# Patient Record
Sex: Female | Born: 1955
Health system: Southern US, Community
[De-identification: ages and names within clinical notes are randomized; demographics above are authoritative.]

## PROBLEM LIST (undated history)

## (undated) DIAGNOSIS — R112 Nausea with vomiting, unspecified: Secondary | ICD-10-CM

## (undated) DIAGNOSIS — I10 Essential (primary) hypertension: Secondary | ICD-10-CM

## (undated) DIAGNOSIS — S0300XA Dislocation of jaw, unspecified side, initial encounter: Secondary | ICD-10-CM

## (undated) DIAGNOSIS — Z9889 Other specified postprocedural states: Secondary | ICD-10-CM

## (undated) DIAGNOSIS — K219 Gastro-esophageal reflux disease without esophagitis: Secondary | ICD-10-CM

## (undated) DIAGNOSIS — F419 Anxiety disorder, unspecified: Secondary | ICD-10-CM

## (undated) HISTORY — PX: MYOMECTOMY: SHX85

## (undated) HISTORY — PX: OTHER SURGICAL HISTORY: SHX169

## (undated) HISTORY — PX: SHOULDER SURGERY: SHX246

## (undated) HISTORY — PX: BREAST SURGERY: SHX581

---

## 1996-03-23 HISTORY — PX: MYOMECTOMY: SHX85

## 1997-06-27 ENCOUNTER — Other Ambulatory Visit: Admission: RE | Admit: 1997-06-27 | Discharge: 1997-06-27 | Payer: Self-pay | Admitting: Gynecology

## 1997-07-31 ENCOUNTER — Other Ambulatory Visit: Admission: RE | Admit: 1997-07-31 | Discharge: 1997-07-31 | Payer: Self-pay | Admitting: Gynecology

## 1998-06-04 ENCOUNTER — Other Ambulatory Visit: Admission: RE | Admit: 1998-06-04 | Discharge: 1998-06-04 | Payer: Self-pay | Admitting: Family Medicine

## 2004-07-17 ENCOUNTER — Other Ambulatory Visit: Admission: RE | Admit: 2004-07-17 | Discharge: 2004-07-17 | Payer: Self-pay | Admitting: Obstetrics and Gynecology

## 2004-10-24 ENCOUNTER — Ambulatory Visit (HOSPITAL_COMMUNITY): Admission: RE | Admit: 2004-10-24 | Discharge: 2004-10-24 | Payer: Self-pay | Admitting: Specialist

## 2005-03-26 ENCOUNTER — Ambulatory Visit: Payer: Self-pay | Admitting: Internal Medicine

## 2010-04-13 ENCOUNTER — Encounter: Payer: Self-pay | Admitting: Specialist

## 2012-10-17 ENCOUNTER — Other Ambulatory Visit (HOSPITAL_COMMUNITY): Payer: Self-pay | Admitting: Nurse Practitioner

## 2012-10-17 DIAGNOSIS — Z139 Encounter for screening, unspecified: Secondary | ICD-10-CM

## 2012-10-20 ENCOUNTER — Ambulatory Visit (HOSPITAL_COMMUNITY)
Admission: RE | Admit: 2012-10-20 | Discharge: 2012-10-20 | Disposition: A | Discharge status: Home / Self Care | Payer: Self-pay | Source: Ambulatory Visit | Attending: Family Medicine | Admitting: Family Medicine

## 2012-10-20 DIAGNOSIS — Z139 Encounter for screening, unspecified: Secondary | ICD-10-CM

## 2013-09-06 ENCOUNTER — Encounter (HOSPITAL_COMMUNITY): Payer: Self-pay | Admitting: Emergency Medicine

## 2013-09-06 ENCOUNTER — Emergency Department (HOSPITAL_COMMUNITY)
Admission: EM | Admit: 2013-09-06 | Discharge: 2013-09-06 | Discharge status: Against Medical Advice | Payer: Self-pay | Attending: Emergency Medicine | Admitting: Emergency Medicine

## 2013-09-06 ENCOUNTER — Emergency Department (HOSPITAL_COMMUNITY): Payer: Self-pay

## 2013-09-06 DIAGNOSIS — F411 Generalized anxiety disorder: Secondary | ICD-10-CM | POA: Insufficient documentation

## 2013-09-06 DIAGNOSIS — Z7982 Long term (current) use of aspirin: Secondary | ICD-10-CM | POA: Insufficient documentation

## 2013-09-06 DIAGNOSIS — R0602 Shortness of breath: Secondary | ICD-10-CM | POA: Insufficient documentation

## 2013-09-06 DIAGNOSIS — R0789 Other chest pain: Secondary | ICD-10-CM | POA: Insufficient documentation

## 2013-09-06 DIAGNOSIS — R079 Chest pain, unspecified: Secondary | ICD-10-CM

## 2013-09-06 LAB — CBC
HEMATOCRIT: 38.7 % (ref 36.0–46.0)
Hemoglobin: 12.8 g/dL (ref 12.0–15.0)
MCH: 29.5 pg (ref 26.0–34.0)
MCHC: 33.1 g/dL (ref 30.0–36.0)
MCV: 89.2 fL (ref 78.0–100.0)
Platelets: 274 10*3/uL (ref 150–400)
RBC: 4.34 MIL/uL (ref 3.87–5.11)
RDW: 13.9 % (ref 11.5–15.5)
WBC: 8 10*3/uL (ref 4.0–10.5)

## 2013-09-06 LAB — BASIC METABOLIC PANEL
BUN: 16 mg/dL (ref 6–23)
CO2: 24 mEq/L (ref 19–32)
CREATININE: 0.74 mg/dL (ref 0.50–1.10)
Calcium: 9.5 mg/dL (ref 8.4–10.5)
Chloride: 101 mEq/L (ref 96–112)
GFR calc Af Amer: >90 mL/min (ref >90)
Glucose, Bld: 94 mg/dL (ref 70–99)
Potassium: 4.4 mEq/L (ref 3.7–5.3)
SODIUM: 139 meq/L (ref 137–147)

## 2013-09-06 LAB — I-STAT TROPONIN, ED: Troponin i, poc: 0 ng/mL (ref 0.00–0.08)

## 2013-09-06 LAB — TROPONIN I: Troponin I: <0.3 ng/mL (ref <0.30)

## 2013-09-06 NOTE — ED Provider Notes (Signed)
CSN: [id number]     Arrival date & time 09/06/13  1833 History   First MD Initiated Contact with Patient 09/06/13 2009     Chief Complaint  Patient presents with  . Chest Pain     (Consider location/radiation/quality/duration/timing/severity/associated sxs/prior Treatment) HPI  The patient presents to the emergency department with complaints of chest pressure and heaviness that started this afternoon acutely when going from a sitting to standing position. It lasted approximately 5 minutes and was accompanied by difficulty breathing. She reports that she walks back and forth and this helped at resolve. Within the next 30 minutes she walked into her office and reports developing another episode of the same feeling of a band like pressure around her chest with heaviness and having to consciously work on breathing in and out. She reports that eventually tapered off. She denies taking any medications on a daily and reports being a very healthy at baseline. She'll family history of hypertension and her mom has a history of atrial fibrillation. She has never seen a cardiologist and has never had any chest pains or before. She drove herself here to the ER and since then has not had any recurrence of these episodes.  History reviewed. No pertinent past medical history. Past Surgical History  Procedure Laterality Date  . Myomectomy    . Arm surgery    . Shoulder surgery     No family history on file. History  Substance Use Topics  . Smoking status: Never Smoker   . Smokeless tobacco: Not on file  . Alcohol Use: No   OB History   Grav Para Term Preterm Abortions TAB SAB Ect Mult Living                 Review of Systems   Review of Systems  Gen: no weight loss, fevers, chills, night sweats  Eyes: no discharge or drainage, no occular pain or visual changes  Nose: no epistaxis or rhinorrhea  Mouth: no dental pain, no sore throat  Neck: no neck pain  Lungs:No wheezing, coughing or  hemoptysis + SOB CV: + chest pain, No palpitations, dependent edema or orthopnea  Abd: no abdominal pain, nausea, vomiting, diarrhea GU: no dysuria or gross hematuria  MSK:  No muscle weakness or pain Neuro: no headache, no focal neurologic deficits  Skin: no rash or wounds Psyche: + anxious    Allergies  Review of patient's allergies indicates no known allergies.  Home Medications   Prior to Admission medications   Medication Sig Start Date End Date Taking? Authorizing Lamir Racca  aspirin 81 MG tablet Take 324 mg by mouth daily.   Yes Historical Curry Dulski, MD   BP 135/94  Pulse 70  Temp(Src) 99.8 F (37.7 C) (Oral)  Resp 18  Wt 154 lb 9.6 oz (70.126 kg)  SpO2 98% Physical Exam  Nursing note and vitals reviewed. Constitutional: She appears well-developed and well-nourished. No distress.  HENT:  Head: Normocephalic and atraumatic.  Eyes: Pupils are equal, round, and reactive to light.  Neck: Normal range of motion. Neck supple.  Cardiovascular: Normal rate and regular rhythm.   Pulmonary/Chest: Effort normal. She has no decreased breath sounds. She has no wheezes. She exhibits no tenderness, no laceration and no crepitus.  No chest wall tenderness to palpation. Unable to reproduce symptoms  Abdominal: Soft. Bowel sounds are normal. There is no tenderness. There is no guarding and no CVA tenderness.  Neurological: She is alert.  Skin: Skin is warm and dry.  Psychiatric: Her speech is normal. Her mood appears anxious.  Pt appears anxious    ED Course  Procedures (including critical care time) Labs Review Labs Reviewed  CBC  BASIC METABOLIC PANEL  TROPONIN I  Rosezena SensorI-STAT TROPOININ, ED    Imaging Review Dg Chest 2 View  09/06/2013   CLINICAL DATA:  Heavy discomfort and pressure in the mid chest today  EXAM: CHEST  2 VIEW  COMPARISON:  None.  FINDINGS: The heart size and mediastinal contours are within normal limits. There is no focal infiltrate, pulmonary edema, or pleural  effusion. The visualized skeletal structures are unremarkable.  IMPRESSION: No active cardiopulmonary disease.   Electronically Signed   By: Sherian ReinWei-Chen  Lin M.D.   On: 09/06/2013 19:44     EKG Interpretation None     Milinda PointerBRISTOW, Akilah [id number] 06-Sep-2013 18:42:45 [facility] ROUTINE RECORD Normal sinus rhythm Low voltage QRS Septal infarct , age undetermined Abnormal ECG 3325mm/s 6410mm/mV 100Hz  8.0.1 12SL 241 HD CID: [id number] Referred by: Unconfirmed Vent. rate 96 BPM PR interval 134 ms QRS duration 82 ms QT/QTc 350/442 ms P-R-T axes 60 35 48 16-May-1955 (58 yr) Female Caucasian Room: Loc:11 Technician: [id number] Test ind:  MDM   Final diagnoses:  Chest pain   Patient to the ER with complaints of Chest tightness and SOB that happened x 2 and lasted less than 5 minutes in each episodes. She is healthy at baseline and denies ever having episodes of the same in the past.   9: 46 pm Initially her work-up is negative. Her chest xray is normal, labs and troponin are reassuring. She had a second Troponin ordered at 3 hours which is also negative. Will have patient ambulated in the ED to ensure she is not having pain exacerbated with exertion. Currently pain is resolved with no intervention.  11:06 pm Delta Troponin returned negative. I feel that patients symptoms are not consistent with ACS. Patient was waiting for my attending to see her but no longer wanted to wait and left without letting anybody know.  Dorthula Matasiffany G Greene, PA-C 09/06/13 2308

## 2013-09-06 NOTE — ED Notes (Signed)
Pt. reports chest pressure/heaviness onset this afternoon , denies SOB , nausea or diaphoresis .

## 2013-09-06 NOTE — ED Notes (Signed)
Pt left without notifying ED staff. Neva SeatGreene PA made aware.

## 2013-09-07 NOTE — ED Provider Notes (Signed)
Medical screening examination/treatment/procedure(s) were performed by non-physician practitioner and as supervising physician I was immediately available for consultation/collaboration.  Elliott L Wentz, MD 09/07/13 0013 

## 2017-06-04 DIAGNOSIS — H5213 Myopia, bilateral: Secondary | ICD-10-CM | POA: Diagnosis not present

## 2017-10-01 ENCOUNTER — Other Ambulatory Visit: Payer: Self-pay | Admitting: Family Medicine

## 2017-10-01 DIAGNOSIS — F39 Unspecified mood [affective] disorder: Secondary | ICD-10-CM | POA: Diagnosis not present

## 2017-10-01 DIAGNOSIS — R7301 Impaired fasting glucose: Secondary | ICD-10-CM | POA: Diagnosis not present

## 2017-10-01 DIAGNOSIS — Z1231 Encounter for screening mammogram for malignant neoplasm of breast: Secondary | ICD-10-CM

## 2017-10-01 DIAGNOSIS — R635 Abnormal weight gain: Secondary | ICD-10-CM | POA: Diagnosis not present

## 2017-10-01 DIAGNOSIS — I10 Essential (primary) hypertension: Secondary | ICD-10-CM | POA: Diagnosis not present

## 2017-10-01 MED FILL — CITALOPRAM HBR 20 MG TABLET: 20 | 90 days supply | Qty: 90 | Fill #0

## 2017-10-01 MED FILL — LISINOPRIL 10 MG TABLET: 10 | 90 days supply | Qty: 90 | Fill #0

## 2017-10-01 MED FILL — QUETIAPINE FUMARATE 100 MG: 100 | 90 days supply | Qty: 90 | Fill #0

## 2017-10-01 MED FILL — CYCLOBENZAPRINE 10 MG TAB: 10 | 30 days supply | Qty: 90 | Fill #0

## 2017-10-04 MED FILL — clonazePAM 0.5 MG TABS: 0.5 | 30 days supply | Qty: 30 | Fill #0

## 2017-11-19 MED FILL — CYCLOBENZAPRINE 10 MG TAB: 10 | 30 days supply | Qty: 90 | Fill #1

## 2017-12-06 DIAGNOSIS — F4320 Adjustment disorder, unspecified: Secondary | ICD-10-CM | POA: Diagnosis not present

## 2017-12-06 DIAGNOSIS — G479 Sleep disorder, unspecified: Secondary | ICD-10-CM | POA: Diagnosis not present

## 2017-12-06 DIAGNOSIS — I10 Essential (primary) hypertension: Secondary | ICD-10-CM | POA: Diagnosis not present

## 2017-12-06 DIAGNOSIS — F39 Unspecified mood [affective] disorder: Secondary | ICD-10-CM | POA: Diagnosis not present

## 2017-12-21 MED FILL — CYCLOBENZAPRINE 10 MG TAB: 10 | 30 days supply | Qty: 90 | Fill #2

## 2017-12-24 MED FILL — clonazePAM 0.5 MG TABS: 0.5 | 30 days supply | Qty: 30 | Fill #0

## 2018-01-07 MED FILL — LISINOPRIL 10 MG TABLET: 10 | 90 days supply | Qty: 90 | Fill #0

## 2018-01-07 MED FILL — QUETIAPINE FUMARATE 100 MG: 100 | 90 days supply | Qty: 90 | Fill #1

## 2018-01-07 MED FILL — CITALOPRAM HBR 20 MG TABLET: 20 | 90 days supply | Qty: 90 | Fill #1

## 2018-02-18 MED FILL — clonazePAM 0.5 MG TABS: 0.5 | 30 days supply | Qty: 30 | Fill #1

## 2018-02-21 MED FILL — CYCLOBENZAPRINE 10 MG TAB: 10 | 30 days supply | Qty: 90 | Fill #3

## 2018-04-07 ENCOUNTER — Emergency Department (HOSPITAL_COMMUNITY)
Admission: EM | Admit: 2018-04-07 | Discharge: 2018-04-07 | Disposition: A | Discharge status: Home / Self Care | Payer: 59 | Attending: Emergency Medicine | Admitting: Emergency Medicine

## 2018-04-07 ENCOUNTER — Encounter (HOSPITAL_COMMUNITY): Payer: Self-pay

## 2018-04-07 ENCOUNTER — Emergency Department (HOSPITAL_COMMUNITY): Payer: 59

## 2018-04-07 DIAGNOSIS — Y929 Unspecified place or not applicable: Secondary | ICD-10-CM | POA: Diagnosis not present

## 2018-04-07 DIAGNOSIS — S91311A Laceration without foreign body, right foot, initial encounter: Secondary | ICD-10-CM | POA: Insufficient documentation

## 2018-04-07 DIAGNOSIS — W25XXXA Contact with sharp glass, initial encounter: Secondary | ICD-10-CM | POA: Insufficient documentation

## 2018-04-07 DIAGNOSIS — Y999 Unspecified external cause status: Secondary | ICD-10-CM | POA: Insufficient documentation

## 2018-04-07 DIAGNOSIS — Y9389 Activity, other specified: Secondary | ICD-10-CM | POA: Insufficient documentation

## 2018-04-07 MED ORDER — LIDOCAINE-EPINEPHRINE (PF) 2 %-1:200000 IJ SOLN
10.0000 mL | Freq: Once | INTRAMUSCULAR | Status: AC
  Administered 2018-04-07: 10 mL
  Filled 2018-04-07: qty 20

## 2018-04-07 MED ORDER — NAPROXEN 500 MG PO TABS: 500.0000 mg | ORAL_TABLET | Freq: Two times a day (BID) | ORAL | 0 refills | Status: DC

## 2018-04-07 NOTE — ED Provider Notes (Signed)
[facility] EMERGENCY DEPARTMENT Provider Note   CSN: [id number] Arrival date & time: 04/07/18  1704   History   Chief Complaint Chief Complaint  Patient presents with  . Extremity Laceration    HPI Elizabeth Roberts is a 63 y.o. female presenting with a right foot extremity laceration onset at 4:30pm today. Patient states she was attempting to stop her dogs from fighting and accidentally kicked a Ship broker. Patient reports bleeding since the incident.  Patient reports walking makes her symptoms worse and resting makes the pain better. Last tetanus shot was in 2016. Patient denies weakness, numbness, or paresthesias. Patient reports difficulty ambulating due to pain. Patient reports taking aspirin daily. Patient denies ankle or toe pain. Patient denies a history of diabetes.   HPI  History reviewed. No pertinent past medical history.  There are no active problems to display for this patient.   Past Surgical History:  Procedure Laterality Date  . arm surgery    . MYOMECTOMY    . SHOULDER SURGERY       OB History   No obstetric history on file.      Home Medications    Prior to Admission medications   Medication Sig Start Date End Date Taking? Authorizing Provider  aspirin 81 MG tablet Take 324 mg by mouth daily.    [provider]  naproxen (NAPROSYN) 500 MG tablet Take 1 tablet (500 mg total) by mouth 2 (two) times daily. 04/07/18   Leretha Dykes, PA-C    Family History History reviewed. No pertinent family history.  Social History Social History   Tobacco Use  . Smoking status: Never Smoker  Substance Use Topics  . Alcohol use: No  . Drug use: No     Allergies   Patient has no known allergies.   Review of Systems Review of Systems  Constitutional: Negative for chills, diaphoresis and fever.  Respiratory: Negative for shortness of breath.   Cardiovascular: Negative for leg swelling.  Gastrointestinal: Negative for abdominal  pain, nausea and vomiting.  Musculoskeletal: Positive for arthralgias, gait problem and joint swelling. Negative for back pain.  Skin: Positive for wound. Negative for rash.  Allergic/Immunologic: Negative for immunocompromised state.  Hematological: Does not bruise/bleed easily.     Physical Exam Updated Vital Signs BP 138/87 (BP Location: Right Arm)   Pulse 87   Temp 99.1 F (37.3 C) (Oral)   Resp 17   SpO2 98%   Physical Exam Vitals signs and nursing note reviewed.  Constitutional:      General: She is not in acute distress.    Appearance: She is well-developed. She is not diaphoretic.  HENT:     Head: Normocephalic and atraumatic.  Cardiovascular:     Rate and Rhythm: Normal rate and regular rhythm.     Heart sounds: Normal heart sounds. No murmur. No friction rub. No gallop.   Pulmonary:     Effort: Pulmonary effort is normal. No respiratory distress.     Breath sounds: Normal breath sounds. No wheezing or rales.  Abdominal:     Palpations: Abdomen is soft.     Tenderness: There is no abdominal tenderness.  Musculoskeletal: Normal range of motion.       Feet:  Skin:    Findings: Laceration present. No erythema or rash.     Comments: Heavy bleeding noted on exam. 6cm laceration noted over the medial aspect of right foot. Full ROM of ankle, foot, and toes. 5/5 strength with dorsiflexion and  plantar flexion. Sensation intact. 2+ DP pulses.   Neurological:     Mental Status: She is alert and oriented to person, place, and time.      ED Treatments / Results  Labs (all labs ordered are listed, but only abnormal results are displayed) Labs Reviewed - No data to display  EKG None  Radiology Dg Foot Complete Right  Result Date: 04/07/2018 CLINICAL DATA:  63 year old female status post foot lacerations and pain after kicking a mirror. EXAM: RIGHT FOOT COMPLETE - 3+ VIEW COMPARISON:  None. FINDINGS: Abundant dressing material about the right foot, limiting soft  tissue detail. No radiopaque foreign body identified. Right foot osseous structures appear intact. No fracture or dislocation identified. IMPRESSION: 1. No radiopaque foreign body, but note that not all glass is radiopaque. 2. No acute fracture or dislocation identified about the right foot. Electronically Signed   By: Odessa Fleming M.D.   On: 04/07/2018 18:45    Procedures .Marland KitchenLaceration Repair Date/Time: 04/07/2018 11:34 PM Performed by: Leretha Dykes, PA-C Authorized by: Leretha Dykes, PA-C   Consent:    Consent obtained:  Verbal   Consent given by:  Patient   Risks discussed:  Infection, poor wound healing and pain   Alternatives discussed:  No treatment Anesthesia (see MAR for exact dosages):    Anesthesia method:  Local infiltration   Local anesthetic:  Lidocaine 2% WITH epi Laceration details:    Location:  Foot   Foot location:  Sole of R foot   Length (cm):  6   Depth (mm):  6 Repair type:    Repair type:  Complex Pre-procedure details:    Preparation:  Patient was prepped and draped in usual sterile fashion and imaging obtained to evaluate for foreign bodies Exploration:    Hemostasis achieved with:  Direct pressure   Wound exploration: wound explored through full range of motion     Contaminated: no   Treatment:    Area cleansed with:  Betadine   Amount of cleaning:  Standard   Irrigation solution:  Sterile saline   Irrigation method:  Pressure wash   Visualized foreign bodies/material removed: no   Skin repair:    Repair method:  Sutures   Suture size:  5-0   Suture material:  Prolene   Suture technique:  Horizontal mattress   Number of sutures:  5 Approximation:    Approximation:  Close Post-procedure details:    Dressing:  Bulky dressing   Patient tolerance of procedure:  Tolerated well, no immediate complications   (including critical care time)  Medications Ordered in ED Medications  lidocaine-EPINEPHrine (XYLOCAINE W/EPI) 2 %-1:200000 (PF) injection  10 mL (10 mLs Infiltration Given 04/07/18 2028)     Initial Impression / Assessment and Plan / ED Course  I have reviewed the triage vital signs and the nursing notes.  Pertinent labs & imaging results that were available during my care of the patient were reviewed by me and considered in my medical decision making (see chart for details).  Clinical Course as of Apr 07 2322  Thu Apr 07, 2018  2006 No acute fracture or dislocation identified about the right foot.  DG Foot Complete Right [AH]    Clinical Course User Index [AH] Leretha Dykes, PA-C   Patient presents with a foot laceration. Laceration was repaired. Pressure irrigation performed. Wound explored and base of wound visualized without evidence of foreign body.  Laceration occurred < 8 hours prior to repair which was well tolerated.  Pt has no comorbidities to effect normal wound healing. Pt discharged without antibiotics.  Discussed suture home care with patient and answered questions. Pt to follow-up for wound check in 24 to 48 hours and suture removal in 10-14 days; they are to return to the ED sooner for signs of infection. Pt is hemodynamically stable with no complaints prior to dc.   Findings and plan of care discussed with supervising physician Dr. Denton LankSteinl.  Final Clinical Impressions(s) / ED Diagnoses   Final diagnoses:  Laceration of right foot, initial encounter    ED Discharge Orders         Ordered    naproxen (NAPROSYN) 500 MG tablet  2 times daily     04/07/18 2215           Leretha DykesHernandez, Jariel Drost P, [place]PA-C 04/07/18 2337    Cathren LaineSteinl, Kevin, MD 04/14/18 1400

## 2018-04-07 NOTE — Discharge Instructions (Addendum)
You have been seen today for right foot laceration. Please read and follow all provided instructions.   1. Medications: naproxen for pain, usual home medications 2. Treatment: rest, drink plenty of fluids, your sutures will have to be removed in 10-14 days 3. Follow Up: Please follow up with your primary doctor in 1-2 days for discussion of your diagnoses and further evaluation after today's visit; if you do not have a primary care doctor use the resource guide provided to find one; Please return to the ER for any new or worsening symptoms. Please obtain all of your results from medical records or have your doctors office obtain the results - share them with your doctor - you should be seen at your doctors office. Call today to arrange your follow up.   Take medications as prescribed. Please review all of the medicines and only take them if you do not have an allergy to them. Return to the emergency room for worsening condition or new concerning symptoms. Follow up with your regular doctor. If you don't have a regular doctor use one of the numbers below to establish a primary care doctor.  Please be aware that if you are taking birth control pills, taking other prescriptions, ESPECIALLY ANTIBIOTICS may make the birth control ineffective - if this is the case, either do not engage in sexual activity or use alternative methods of birth control such as condoms until you have finished the medicine and your family doctor says it is OK to restart them. If you are on a blood thinner such as COUMADIN, be aware that any other medicine that you take may cause the coumadin to either work too much, or not enough - you should have your coumadin level rechecked in next 7 days if this is the case.  ?  It is also a possibility that you have an allergic reaction to any of the medicines that you have been prescribed - Everybody reacts differently to medications and while MOST people have no trouble with most medicines, you  may have a reaction such as nausea, vomiting, rash, swelling, shortness of breath. If this is the case, please stop taking the medicine immediately and contact your physician.  ?  You should return to the ER if you develop severe or worsening symptoms.   Emergency Department Resource Guide 1) Find a Doctor and Pay Out of Pocket Although you won't have to find out who is covered by your insurance plan, it is a good idea to ask around and get recommendations. You will then need to call the office and see if the doctor you have chosen will accept you as a new patient and what types of options they offer for patients who are self-pay. Some doctors offer discounts or will set up payment plans for their patients who do not have insurance, but you will need to ask so you aren't surprised when you get to your appointment.  2) Contact Your Local Health Department Not all health departments have doctors that can see patients for sick visits, but many do, so it is worth a call to see if yours does. If you don't know where your local health department is, you can check in your phone book. The CDC also has a tool to help you locate your state's health department, and many state websites also have listings of all of their local health departments.  3) Find a Walk-in Clinic If your illness is not likely to be very severe or complicated, you  may want to try a walk in clinic. These are popping up all over the country in pharmacies, drugstores, and shopping centers. They're usually staffed by nurse practitioners or physician assistants that have been trained to treat common illnesses and complaints. They're usually fairly quick and inexpensive. However, if you have serious medical issues or chronic medical problems, these are probably not your best option.  No Primary Care Doctor: Call Health Connect at  [phone] - they can help you locate a primary care doctor that  accepts your insurance, provides certain services,  etc. Physician Referral [phone]  Emergency Department Resource Guide 1) Find a Doctor and Pay Out of Pocket Although you won't have to find out who is covered by your insurance plan, it is a good idea to ask around and get recommendations. You will then need to call the office and see if the doctor you have chosen will accept you as a new patient and what types of options they offer for patients who are self-pay. Some doctors offer discounts or will set up payment plans for their patients who do not have insurance, but you will need to ask so you aren't surprised when you get to your appointment.  2) Contact Your Local Health Department Not all health departments have doctors that can see patients for sick visits, but many do, so it is worth a call to see if yours does. If you don't know where your local health department is, you can check in your phone book. The CDC also has a tool to help you locate your state's health department, and many state websites also have listings of all of their local health departments.  3) Find a Walk-in Clinic If your illness is not likely to be very severe or complicated, you may want to try a walk in clinic. These are popping up all over the country in pharmacies, drugstores, and shopping centers. They're usually staffed by nurse practitioners or physician assistants that have been trained to treat common illnesses and complaints. They're usually fairly quick and inexpensive. However, if you have serious medical issues or chronic medical problems, these are probably not your best option.  No Primary Care Doctor: Call Health Connect at  [phone] - they can help you locate a primary care doctor that  accepts your insurance, provides certain services, etc. Physician Referral Service- [phone]  Chronic Pain Problems: Organization         Address  Phone   Notes  [facility]  [phone] Patients need to be referred by their  primary care doctor.   Medication Assistance: Organization         Address  Phone   Notes  [facility] Medication [facility] [address] [place], [place] [address] [phone] --Must be a resident of [facility] -- Must have NO insurance coverage whatsoever (no Medicaid/ Medicare, etc.) -- The pt. MUST have a primary care doctor that directs their care regularly and follows them in the community   [facility]  [phone]   [employer]  [phone]    Agencies that provide inexpensive medical care: Organization         Address  Phone   Notes  [facility]  [phone]   [facility]    [phone]   [facility] [address] [place], [place] [address] [phone]   [facility] [address] [place], [place] (  [phone]   Planned Parenthood    [phone]   [facility]    [phone]   [facility] and [facility]  [address], [place] Phone:  [phone], Fax:  [phone] Hours of Operation:  9 am - 6 pm, M-F.  Also accepts Medicaid/Medicare and self-pay.  [facility]  [address], [place] Phone: [phone], Fax: [phone]. Hours of Operation:  8:30 am - 5:30 pm, M-F.  Also accepts Medicaid and self-pay.  [facility] [place] [address] [place] Phone: [phone]   [facility] [address] Natasha Bence [place], [place] [phone], Ext. [phone] Mondays & Thursdays: 7-9 AM.  First 15 patients are seen on a first come, first serve basis.    Medicaid-accepting [facility] Providers:  Organization         Address  Phone   Notes  [facility] [address], [place] [phone] Also accepts self-pay patients.  [facility] [address] Laurell Josephs [place], [place]  [phone]   [facility] [address], [place] [phone]   [facility] [address], [place] [phone]   Renaye Rakers [address], [place]   [phone] Only accepts [place] patients after they have their name applied to their card.   Self-Pay (no insurance) in [facility]:  Organization         Address  Phone   Notes  Sickle Cell Patients, [facility] [address] [place], [place] [phone]   [facility] [address] [place], [place] [phone]   [facility]  [address], [place] [phone]   [facility]/Dr. Osei-Bonsu  [address] [place] or [address], [place] [phone] Phone number for both [place] and [place] locations is the same.  [facility]gent [facility] and [facility] [address], [place] [phone]   [facility] [address] [place] or [address] [phone]   [facility] [address], [place] [phone], phone; [phone], fax Sees patients 1st and 3rd Saturday of every month.  Must not qualify for public or private insurance (i.e. Medicaid, Medicare, [place] Health Choice, Veterans' Benefits)  Household income should be no more than 200% of the poverty level The clinic cannot treat you if you are pregnant or think you are pregnant  Sexually transmitted diseases are not treated at the clinic.

## 2018-04-07 NOTE — ED Notes (Signed)
Pt given bedside commode; able to use independently.

## 2018-04-07 NOTE — ED Notes (Addendum)
Pt's foot is still bleeding at this time. Floor was cleaned and pt was encouraged to keep foot as still as possible.

## 2018-04-07 NOTE — ED Triage Notes (Signed)
Pt has 2 inch laceration to foot that occurred when accidentally kicking a mirror trying to break up her dogs from fighting. VSS. PMS intact.

## 2018-04-08 DIAGNOSIS — S91311D Laceration without foreign body, right foot, subsequent encounter: Secondary | ICD-10-CM | POA: Diagnosis not present

## 2018-04-08 DIAGNOSIS — M79671 Pain in right foot: Secondary | ICD-10-CM | POA: Diagnosis not present

## 2018-04-13 MED FILL — QUETIAPINE FUMARATE 100 MG: 100 | 90 days supply | Qty: 90 | Fill #2

## 2018-04-13 MED FILL — SSD 1% CREAM: 1 | 30 days supply | Qty: 50 | Fill #0

## 2018-04-13 MED FILL — CYCLOBENZAPRINE 10 MG TAB: 10 | 30 days supply | Qty: 90 | Fill #0

## 2018-04-13 MED FILL — LISINOPRIL 10 MG TABS: 10 | 90 days supply | Qty: 90 | Fill #0

## 2018-04-15 DIAGNOSIS — M79671 Pain in right foot: Secondary | ICD-10-CM | POA: Diagnosis not present

## 2018-04-15 DIAGNOSIS — S91311D Laceration without foreign body, right foot, subsequent encounter: Secondary | ICD-10-CM | POA: Diagnosis not present

## 2018-04-15 MED FILL — DOXYCYCLINE HYCLATE 100 MG: 100 | 10 days supply | Qty: 20 | Fill #0

## 2018-04-21 DIAGNOSIS — M79671 Pain in right foot: Secondary | ICD-10-CM | POA: Diagnosis not present

## 2018-04-21 DIAGNOSIS — B999 Unspecified infectious disease: Secondary | ICD-10-CM | POA: Diagnosis not present

## 2018-04-21 DIAGNOSIS — S91311D Laceration without foreign body, right foot, subsequent encounter: Secondary | ICD-10-CM | POA: Diagnosis not present

## 2018-05-02 DIAGNOSIS — M79671 Pain in right foot: Secondary | ICD-10-CM | POA: Diagnosis not present

## 2018-05-02 DIAGNOSIS — L03115 Cellulitis of right lower limb: Secondary | ICD-10-CM | POA: Diagnosis not present

## 2018-05-02 DIAGNOSIS — S91311D Laceration without foreign body, right foot, subsequent encounter: Secondary | ICD-10-CM | POA: Diagnosis not present

## 2018-05-09 DIAGNOSIS — I10 Essential (primary) hypertension: Secondary | ICD-10-CM | POA: Diagnosis not present

## 2018-05-09 DIAGNOSIS — S91311D Laceration without foreign body, right foot, subsequent encounter: Secondary | ICD-10-CM | POA: Diagnosis not present

## 2018-05-09 DIAGNOSIS — M79671 Pain in right foot: Secondary | ICD-10-CM | POA: Diagnosis not present

## 2018-05-09 DIAGNOSIS — L03115 Cellulitis of right lower limb: Secondary | ICD-10-CM | POA: Diagnosis not present

## 2018-05-16 DIAGNOSIS — I10 Essential (primary) hypertension: Secondary | ICD-10-CM | POA: Diagnosis not present

## 2018-05-16 DIAGNOSIS — L03115 Cellulitis of right lower limb: Secondary | ICD-10-CM | POA: Diagnosis not present

## 2018-05-16 DIAGNOSIS — M79671 Pain in right foot: Secondary | ICD-10-CM | POA: Diagnosis not present

## 2018-05-16 DIAGNOSIS — S91311D Laceration without foreign body, right foot, subsequent encounter: Secondary | ICD-10-CM | POA: Diagnosis not present

## 2018-06-01 MED FILL — CYCLOBENZAPRINE 10 MG TAB: 10 | 30 days supply | Qty: 90 | Fill #1 | Status: TO

## 2018-06-15 DIAGNOSIS — I10 Essential (primary) hypertension: Secondary | ICD-10-CM | POA: Diagnosis not present

## 2018-06-15 DIAGNOSIS — L905 Scar conditions and fibrosis of skin: Secondary | ICD-10-CM | POA: Diagnosis not present

## 2018-07-02 MED FILL — LISINOPRIL 10 MG TABLET: 10 | 90 days supply | Qty: 90 | Fill #0

## 2018-07-02 MED FILL — CITALOPRAM HBR 20 MG TABLET: 20 | 90 days supply | Qty: 90 | Fill #0

## 2018-07-02 MED FILL — clonazePAM 0.5 MG TABS: 0.5 | 30 days supply | Qty: 30 | Fill #0

## 2018-07-02 MED FILL — QUETIAPINE FUMARATE 100 MG: 100 | 90 days supply | Qty: 90 | Fill #0

## 2018-07-12 MED FILL — CYCLOBENZAPRINE HCL 10 MG T: 10 | 30 days supply | Qty: 90 | Fill #0 | Status: TO

## 2018-08-19 MED FILL — clonazePAM 0.5 MG TABS: 0.5 | 30 days supply | Qty: 30 | Fill #0

## 2018-08-22 MED FILL — CYCLOBENZAPRINE 10 MG TAB: 10 | 30 days supply | Qty: 90 | Fill #0

## 2018-10-04 MED FILL — CITALOPRAM HBR 20 MG TABLET: 20 | 90 days supply | Qty: 90 | Fill #0

## 2018-10-05 MED FILL — CYCLOBENZAPRINE 10 MG TAB: 10 | 30 days supply | Qty: 90 | Fill #0

## 2018-10-06 ENCOUNTER — Other Ambulatory Visit: Payer: Self-pay | Admitting: Family Medicine

## 2018-10-06 DIAGNOSIS — Z1231 Encounter for screening mammogram for malignant neoplasm of breast: Secondary | ICD-10-CM

## 2018-10-06 DIAGNOSIS — I10 Essential (primary) hypertension: Secondary | ICD-10-CM | POA: Diagnosis not present

## 2018-10-06 DIAGNOSIS — L905 Scar conditions and fibrosis of skin: Secondary | ICD-10-CM | POA: Diagnosis not present

## 2018-10-06 DIAGNOSIS — L989 Disorder of the skin and subcutaneous tissue, unspecified: Secondary | ICD-10-CM | POA: Diagnosis not present

## 2018-10-06 MED FILL — clonazePAM 0.5 MG TABS: 0.5 | 30 days supply | Qty: 30 | Fill #0

## 2018-10-24 DIAGNOSIS — Z1211 Encounter for screening for malignant neoplasm of colon: Secondary | ICD-10-CM | POA: Diagnosis not present

## 2018-10-24 MED FILL — PLENVU 140 GM SOLR: 140 | 1 days supply | Qty: 3 | Fill #0

## 2018-11-08 MED FILL — LISINOPRIL 10 MG TABS: 10 | 90 days supply | Qty: 90 | Fill #0

## 2018-11-17 DIAGNOSIS — K633 Ulcer of intestine: Secondary | ICD-10-CM | POA: Diagnosis not present

## 2018-11-17 DIAGNOSIS — K6389 Other specified diseases of intestine: Secondary | ICD-10-CM | POA: Diagnosis not present

## 2018-11-17 DIAGNOSIS — K559 Vascular disorder of intestine, unspecified: Secondary | ICD-10-CM | POA: Diagnosis not present

## 2018-11-17 DIAGNOSIS — Z1211 Encounter for screening for malignant neoplasm of colon: Secondary | ICD-10-CM | POA: Diagnosis not present

## 2018-11-17 DIAGNOSIS — K5730 Diverticulosis of large intestine without perforation or abscess without bleeding: Secondary | ICD-10-CM | POA: Diagnosis not present

## 2018-11-21 MED FILL — CYCLOBENZAPRINE 10 MG TAB: 10 | 30 days supply | Qty: 90 | Fill #0

## 2018-11-22 ENCOUNTER — Other Ambulatory Visit: Payer: Self-pay

## 2018-11-22 ENCOUNTER — Ambulatory Visit
Admission: RE | Admit: 2018-11-22 | Discharge: 2018-11-22 | Disposition: A | Discharge status: Home / Self Care | Payer: 59 | Source: Ambulatory Visit | Attending: Family Medicine | Admitting: Family Medicine

## 2018-11-22 DIAGNOSIS — Z1231 Encounter for screening mammogram for malignant neoplasm of breast: Secondary | ICD-10-CM

## 2018-11-24 ENCOUNTER — Other Ambulatory Visit: Payer: Self-pay | Admitting: Family Medicine

## 2018-11-24 DIAGNOSIS — R928 Other abnormal and inconclusive findings on diagnostic imaging of breast: Secondary | ICD-10-CM

## 2018-11-29 MED FILL — clonazePAM 0.5 MG TABS: 0.5 | 30 days supply | Qty: 30 | Fill #0

## 2018-11-30 ENCOUNTER — Other Ambulatory Visit: Payer: Self-pay

## 2018-11-30 ENCOUNTER — Ambulatory Visit
Admission: RE | Admit: 2018-11-30 | Discharge: 2018-11-30 | Disposition: A | Discharge status: Home / Self Care | Payer: 59 | Source: Ambulatory Visit | Attending: Family Medicine | Admitting: Family Medicine

## 2018-11-30 ENCOUNTER — Other Ambulatory Visit: Payer: Self-pay | Admitting: Family Medicine

## 2018-11-30 DIAGNOSIS — N6312 Unspecified lump in the right breast, upper inner quadrant: Secondary | ICD-10-CM | POA: Diagnosis not present

## 2018-11-30 DIAGNOSIS — R928 Other abnormal and inconclusive findings on diagnostic imaging of breast: Secondary | ICD-10-CM

## 2018-11-30 DIAGNOSIS — N6310 Unspecified lump in the right breast, unspecified quadrant: Secondary | ICD-10-CM

## 2018-11-30 MED FILL — QUETIAPINE FUMARATE 100 MG: 100 | 90 days supply | Qty: 90 | Fill #0

## 2018-12-05 ENCOUNTER — Ambulatory Visit
Admission: RE | Admit: 2018-12-05 | Discharge: 2018-12-05 | Disposition: A | Discharge status: Home / Self Care | Payer: 59 | Source: Ambulatory Visit | Attending: Family Medicine | Admitting: Family Medicine

## 2018-12-05 ENCOUNTER — Other Ambulatory Visit: Payer: Self-pay

## 2018-12-05 DIAGNOSIS — N6310 Unspecified lump in the right breast, unspecified quadrant: Secondary | ICD-10-CM

## 2018-12-05 DIAGNOSIS — N6011 Diffuse cystic mastopathy of right breast: Secondary | ICD-10-CM | POA: Diagnosis not present

## 2018-12-05 DIAGNOSIS — N6312 Unspecified lump in the right breast, upper inner quadrant: Secondary | ICD-10-CM | POA: Diagnosis not present

## 2018-12-05 DIAGNOSIS — R928 Other abnormal and inconclusive findings on diagnostic imaging of breast: Secondary | ICD-10-CM | POA: Diagnosis not present

## 2018-12-14 ENCOUNTER — Other Ambulatory Visit: Payer: Self-pay | Admitting: General Surgery

## 2018-12-14 DIAGNOSIS — R928 Other abnormal and inconclusive findings on diagnostic imaging of breast: Secondary | ICD-10-CM | POA: Diagnosis not present

## 2018-12-14 DIAGNOSIS — Z9889 Other specified postprocedural states: Secondary | ICD-10-CM | POA: Diagnosis not present

## 2018-12-14 DIAGNOSIS — Z6830 Body mass index (BMI) 30.0-30.9, adult: Secondary | ICD-10-CM | POA: Diagnosis not present

## 2018-12-19 ENCOUNTER — Other Ambulatory Visit: Payer: Self-pay | Admitting: General Surgery

## 2018-12-19 DIAGNOSIS — R928 Other abnormal and inconclusive findings on diagnostic imaging of breast: Secondary | ICD-10-CM

## 2019-01-11 MED FILL — CYCLOBENZAPRINE 10 MG TAB: 10 | 30 days supply | Qty: 90 | Fill #0

## 2019-01-18 MED FILL — CITALOPRAM HBR 20 MG TABLET: 20 | 90 days supply | Qty: 90 | Fill #0

## 2019-02-01 ENCOUNTER — Encounter (HOSPITAL_BASED_OUTPATIENT_CLINIC_OR_DEPARTMENT_OTHER): Payer: Self-pay

## 2019-02-01 ENCOUNTER — Other Ambulatory Visit: Payer: Self-pay

## 2019-02-04 ENCOUNTER — Other Ambulatory Visit (HOSPITAL_COMMUNITY)
Admission: RE | Admit: 2019-02-04 | Discharge: 2019-02-04 | Disposition: A | Discharge status: Home / Self Care | Payer: 59 | Source: Ambulatory Visit | Attending: General Surgery | Admitting: General Surgery

## 2019-02-04 DIAGNOSIS — Z01812 Encounter for preprocedural laboratory examination: Secondary | ICD-10-CM | POA: Diagnosis not present

## 2019-02-04 DIAGNOSIS — Z20828 Contact with and (suspected) exposure to other viral communicable diseases: Secondary | ICD-10-CM | POA: Insufficient documentation

## 2019-02-04 NOTE — H&P (Signed)
Elizabeth Roberts Location: [facility] Patient #: [id number] DOB: 25-Sep-1955 Married / Language: English / Race: White Female      History of Present Illness  The patient is a 63 year old female who presents with a breast mass. 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 Referred by Dr. Norva Pavlov at the [facility] cause of 2 discordant biopsies in the right breast. PCP is Nadyne Coombes. Acey Lav served as my Biomedical engineer throughout the encounter.      In 2003 she underwent 2 left breast biopsies for completely benign process. This was in another state. Otherwise no breast problems. She gets routine screening mammograms.       Recent mammogram showed 2 small areas of distortion in the right breast. Both of these areas were biopsied. In the upper right breast, middle depth, she showed fibrocystic change and this was felt to be discordant In the right breast, upper inner quadrant middle depth this was biopsied and showed normal breast parenchyma. The clip migrated inferiorly a little bit. This was also felt to be discordant. Biopsy was recommended of both of these areas. I explained that the risk of upgrade cancer here it is not precise but probably somewhere between 5 and 8%. She does not want to take a chance and once these areas excised.      Past history reveals open myomectomy. Left breast biopsy 2 in  2003. BMI 30. Family history is negative for breast, ovarian, prostate, or pancreatic cancer next Lasix was reveals she is married. No children. Denies alcohol or tobacco. Lives in [place]. 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      She'll be scheduled for right breast lumpectomy 2 with radioactive seed localization 2.She agrees with this plan.   Allergies  No Known Allergies  No Known Drug Allergies  Allergies Reconciled   Medication History  clonazePAM (0.5MG  Tablet, Oral)  Active. Citalopram Hydrobromide (20MG  Tablet, Oral) Active. Cyclobenzaprine HCl (10MG  Tablet, Oral) Active. Lisinopril (10MG  Tablet, Oral) Active. QUEtiapine Fumarate (100MG  Tablet, Oral) Active. Plenvu (140GM For Solution, Oral) Active. Multiple Vitamins/Iron (1 (one) Oral) Active. Medications Reconciled  Social History  Caffeine use  Carbonated beverages, Tea. Tobacco use  Never smoker.  Family History  Melanoma  Family Members In General.  Pregnancy / Birth History  Age of men[age]  [age]    Review of Systems  General Not Present- Appetite Loss, Chills, Fatigue, Fever, Night Sweats, Weight Gain and Weight Loss. Skin Not Present- Change in Wart/Mole, Dryness, Hives, Jaundice, New Lesions, Non-Healing Wounds, Rash and Ulcer. HEENT Present- Wears glasses/contact lenses. Not Present- Earache, Hearing Loss, Hoarseness, Nose Bleed, Oral Ulcers, Ringing in the Ears, Seasonal Allergies, Sinus Pain, Sore Throat, Visual Disturbances and Yellow Eyes. Respiratory Not Present- Bloody sputum, Chronic Cough, Difficulty Breathing, Snoring and Wheezing. Breast Present- Breast Mass. Not Present- Breast Pain, Nipple Discharge and Skin Changes. Cardiovascular Not Present- Chest Pain, Difficulty Breathing Lying Down, Leg Cramps, Palpitations, Rapid Heart Rate, Shortness of Breath and Swelling of Extremities. Gastrointestinal Not Present- Abdominal Pain, Bloating, Bloody Stool, Change in Bowel Habits, Chronic diarrhea, Constipation, Difficulty Swallowing, Excessive gas, Gets full quickly at meals, Hemorrhoids, Indigestion, Nausea, Rectal Pain and Vomiting. Female Genitourinary Not Present- Frequency, Nocturia, Painful Urination, Pelvic Pain and Urgency. Musculoskeletal Not Present- Back Pain, Joint Pain, Joint Stiffness, Muscle Pain, Muscle Weakness and Swelling of Extremities. Neurological Not Present- Decreased Memory, Fainting, Headaches, Numbness, Seizures, Tingling, Tremor, Trouble  walking and Weakness. Psychiatric Not Present- Anxiety, Bipolar, Change in Sleep Pattern, Depression, Fearful and Frequent crying. Endocrine Not Present- Cold Intolerance, Excessive Hunger, Hair Changes, Heat Intolerance, Hot flashes and New Diabetes. Hematology Not Present- Blood Thinners, Easy Bruising, Excessive bleeding, Gland problems, HIV and Persistent Infections.  Vitals  Weight: 187.4 lb Height: 66in Body Surface Area: 1.95 m Body Mass Index: 30.25 kg/m  Temp.: 97.65F(Temporal)  Pulse: 104 (Regular)  BP: 148/92 (Sitting, Left Arm, Standard)       Physical Exam  General Mental Status-Alert. General Appearance-Consistent with stated age. Hydration-Well hydrated. Voice-Normal.  Head and Neck Head-normocephalic, atraumatic with no lesions or palpable masses. Trachea-midline. Thyroid Gland Characteristics - normal size and consistency.  Eye Eyeball - Bilateral-Extraocular movements intact. Sclera/Conjunctiva - Bilateral-No scleral icterus.  Chest and Lung Exam Chest and lung exam reveals -quiet, even and easy respiratory effort with no use of accessory muscles and on auscultation, normal breath sounds, no adventitious sounds and normal vocal resonance. Inspection Chest Wall - Normal. Back - normal.  Breast Note: Breasts are large. 2 biopsy sites right breast 12:00 and 2:00. Tiny palpable hematoma at 12:00. No other skin changes. No other masses. No adenopathy.   Cardiovascular Cardiovascular examination reveals -normal heart sounds, regular rate and rhythm with no murmurs and normal pedal pulses bilaterally.  Abdomen Inspection Inspection of the abdomen reveals - No Hernias. Skin - Scar - Note: Well-healed Pfannenstiel incision. Palpation/Percussion Palpation and Percussion of the abdomen reveal - Soft, Non Tender, No Rebound tenderness, No Rigidity (guarding) and No hepatosplenomegaly. Auscultation Auscultation of the abdomen  reveals - Bowel sounds normal.  Neurologic Neurologic evaluation reveals -alert and oriented x 3 with no impairment of recent or remote memory. Mental Status-Normal.  Musculoskeletal Normal Exam - Left-Upper Extremity Strength Normal and Lower Extremity Strength Normal. Normal Exam - Right-Upper Extremity Strength Normal and Lower Extremity Strength Normal.  Lymphatic Head & Neck  General Head & Neck Lymphatics: Bilateral - Description - Normal. Axillary  General Axillary Region: Bilateral - Description - Normal. Tenderness - Non Tender. Femoral & Inguinal  Generalized Femoral & Inguinal Lymphatics: Bilateral - Description - Normal. Tenderness - Non Tender.    Assessment & Plan ABNORMALITY OF RIGHT BREAST ON SCREENING MAMMOGRAM (R92.8)e.   Your recent screening mammograms showed 2 areas of distortion in the upper and slightly in her right breast both of these areas were biopsied and both of them revealed benign findings However, the radiologist feels this is discordant and is still concerned about the low but definite risk of cancer I agree that excision is recommended There is probably a 90-95% chance that he did not have cancer, however  After lengthy discussion, we both decided to go ahead with the excisional biopsy You'll be scheduled for right breast lumpectomy 2 with radioactive seed localization 2 We have discussed the indications, techniques, and risk of the surgery with you in detail  BMI 30.0-30.9,ADULT (Z68.30) HISTORY OF MYOMECTOMY (Z98.890) HISTORY OF LEFT BREAST BIOPSY (Z98.890)    Edsel Petrin. Dalbert Batman, M.D., [facility] General and Minimally invasive Surgery Breast and Colorectal Surgery Office:   [phone]

## 2019-02-05 LAB — NOVEL CORONAVIRUS, NAA (HOSP ORDER, SEND-OUT TO REF LAB; TAT 18-24 HRS): SARS-CoV-2, NAA: NOT DETECTED

## 2019-02-06 ENCOUNTER — Encounter (HOSPITAL_BASED_OUTPATIENT_CLINIC_OR_DEPARTMENT_OTHER)
Admission: RE | Admit: 2019-02-06 | Discharge: 2019-02-06 | Disposition: A | Discharge status: Home / Self Care | Payer: 59 | Source: Ambulatory Visit | Attending: General Surgery | Admitting: General Surgery

## 2019-02-06 ENCOUNTER — Other Ambulatory Visit: Payer: Self-pay

## 2019-02-06 DIAGNOSIS — R928 Other abnormal and inconclusive findings on diagnostic imaging of breast: Secondary | ICD-10-CM | POA: Diagnosis not present

## 2019-02-06 DIAGNOSIS — Z01818 Encounter for other preprocedural examination: Secondary | ICD-10-CM | POA: Insufficient documentation

## 2019-02-06 LAB — CBC WITH DIFFERENTIAL/PLATELET
Abs Immature Granulocytes: 0.01 10*3/uL (ref 0.00–0.07)
Basophils Absolute: 0.1 10*3/uL (ref 0.0–0.1)
Basophils Relative: 1 %
Eosinophils Absolute: 0.3 10*3/uL (ref 0.0–0.5)
Eosinophils Relative: 4 %
HCT: 40 % (ref 36.0–46.0)
Hemoglobin: 12.7 g/dL (ref 12.0–15.0)
Immature Granulocytes: 0 %
Lymphocytes Relative: 34 %
Lymphs Abs: 2.7 10*3/uL (ref 0.7–4.0)
MCH: 28.7 pg (ref 26.0–34.0)
MCHC: 31.8 g/dL (ref 30.0–36.0)
MCV: 90.3 fL (ref 80.0–100.0)
Monocytes Absolute: 0.9 10*3/uL (ref 0.1–1.0)
Monocytes Relative: 11 %
Neutro Abs: 4 10*3/uL (ref 1.7–7.7)
Neutrophils Relative %: 50 %
Platelets: 297 10*3/uL (ref 150–400)
RBC: 4.43 MIL/uL (ref 3.87–5.11)
RDW: 15.9 % — ABNORMAL HIGH (ref 11.5–15.5)
WBC: 8.1 10*3/uL (ref 4.0–10.5)
nRBC: 0 % (ref 0.0–0.2)

## 2019-02-06 LAB — COMPREHENSIVE METABOLIC PANEL
ALT: 34 U/L (ref 0–44)
AST: 33 U/L (ref 15–41)
Albumin: 3.8 g/dL (ref 3.5–5.0)
Alkaline Phosphatase: 74 U/L (ref 38–126)
Anion gap: 12 (ref 5–15)
BUN: 21 mg/dL (ref 8–23)
CO2: 25 mmol/L (ref 22–32)
Calcium: 8.9 mg/dL (ref 8.9–10.3)
Chloride: 102 mmol/L (ref 98–111)
Creatinine, Ser: 0.77 mg/dL (ref 0.44–1.00)
GFR calc Af Amer: >60 mL/min (ref >60)
GFR calc non Af Amer: >60 mL/min (ref >60)
Glucose, Bld: 87 mg/dL (ref 70–99)
Potassium: 4.8 mmol/L (ref 3.5–5.1)
Sodium: 139 mmol/L (ref 135–145)
Total Bilirubin: 0.5 mg/dL (ref 0.3–1.2)
Total Protein: 6.5 g/dL (ref 6.5–8.1)

## 2019-02-06 NOTE — Progress Notes (Signed)

## 2019-02-07 ENCOUNTER — Ambulatory Visit
Admission: RE | Admit: 2019-02-07 | Discharge: 2019-02-07 | Disposition: A | Discharge status: Home / Self Care | Payer: 59 | Source: Ambulatory Visit | Attending: General Surgery | Admitting: General Surgery

## 2019-02-07 ENCOUNTER — Encounter (HOSPITAL_BASED_OUTPATIENT_CLINIC_OR_DEPARTMENT_OTHER): Payer: Self-pay | Admitting: *Deleted

## 2019-02-07 ENCOUNTER — Other Ambulatory Visit: Payer: Self-pay

## 2019-02-07 DIAGNOSIS — R928 Other abnormal and inconclusive findings on diagnostic imaging of breast: Secondary | ICD-10-CM

## 2019-02-08 ENCOUNTER — Other Ambulatory Visit: Payer: Self-pay

## 2019-02-08 ENCOUNTER — Ambulatory Visit
Admission: RE | Admit: 2019-02-08 | Discharge: 2019-02-08 | Disposition: A | Discharge status: Home / Self Care | Payer: 59 | Source: Ambulatory Visit | Attending: General Surgery | Admitting: General Surgery

## 2019-02-08 ENCOUNTER — Ambulatory Visit (HOSPITAL_BASED_OUTPATIENT_CLINIC_OR_DEPARTMENT_OTHER): Payer: 59 | Admitting: Certified Registered"

## 2019-02-08 ENCOUNTER — Encounter (HOSPITAL_BASED_OUTPATIENT_CLINIC_OR_DEPARTMENT_OTHER): Payer: Self-pay | Admitting: *Deleted

## 2019-02-08 ENCOUNTER — Encounter (HOSPITAL_BASED_OUTPATIENT_CLINIC_OR_DEPARTMENT_OTHER)
Admission: RE | Disposition: A | Discharge status: Home / Self Care | Payer: Self-pay | Source: Ambulatory Visit | Attending: General Surgery

## 2019-02-08 ENCOUNTER — Ambulatory Visit (HOSPITAL_BASED_OUTPATIENT_CLINIC_OR_DEPARTMENT_OTHER)
Admission: RE | Admit: 2019-02-08 | Discharge: 2019-02-08 | Disposition: A | Discharge status: Home / Self Care | Payer: 59 | Source: Ambulatory Visit | Attending: General Surgery | Admitting: General Surgery

## 2019-02-08 DIAGNOSIS — Z79899 Other long term (current) drug therapy: Secondary | ICD-10-CM | POA: Diagnosis not present

## 2019-02-08 DIAGNOSIS — I10 Essential (primary) hypertension: Secondary | ICD-10-CM | POA: Diagnosis not present

## 2019-02-08 DIAGNOSIS — R928 Other abnormal and inconclusive findings on diagnostic imaging of breast: Secondary | ICD-10-CM

## 2019-02-08 DIAGNOSIS — F419 Anxiety disorder, unspecified: Secondary | ICD-10-CM | POA: Insufficient documentation

## 2019-02-08 DIAGNOSIS — K219 Gastro-esophageal reflux disease without esophagitis: Secondary | ICD-10-CM | POA: Diagnosis not present

## 2019-02-08 DIAGNOSIS — N6011 Diffuse cystic mastopathy of right breast: Secondary | ICD-10-CM | POA: Diagnosis not present

## 2019-02-08 HISTORY — DX: Gastro-esophageal reflux disease without esophagitis: K21.9

## 2019-02-08 HISTORY — DX: Dislocation of jaw, unspecified side, initial encounter: S03.00XA

## 2019-02-08 HISTORY — DX: Essential (primary) hypertension: I10

## 2019-02-08 HISTORY — PX: BREAST LUMPECTOMY WITH RADIOACTIVE SEED LOCALIZATION: SHX6424

## 2019-02-08 HISTORY — DX: Other specified postprocedural states: Z98.890

## 2019-02-08 HISTORY — DX: Anxiety disorder, unspecified: F41.9

## 2019-02-08 HISTORY — DX: Other abnormal and inconclusive findings on diagnostic imaging of breast: R92.8

## 2019-02-08 HISTORY — DX: Nausea with vomiting, unspecified: R11.2

## 2019-02-08 SURGERY — BREAST LUMPECTOMY WITH RADIOACTIVE SEED LOCALIZATION
Anesthesia: General | Site: Breast | Laterality: Right

## 2019-02-08 MED ORDER — 0.9 % SODIUM CHLORIDE (POUR BTL) OPTIME
TOPICAL | Status: DC | PRN
  Administered 2019-02-08: 200 mL

## 2019-02-08 MED ORDER — CELECOXIB 200 MG PO CAPS
ORAL_CAPSULE | ORAL | Status: AC
  Filled 2019-02-08: qty 1

## 2019-02-08 MED ORDER — DEXAMETHASONE SODIUM PHOSPHATE 10 MG/ML IJ SOLN
INTRAMUSCULAR | Status: DC | PRN
  Administered 2019-02-08: 5 mg via INTRAVENOUS

## 2019-02-08 MED ORDER — GABAPENTIN 300 MG PO CAPS
300.0000 mg | ORAL_CAPSULE | ORAL | Status: AC
  Administered 2019-02-08: 300 mg via ORAL

## 2019-02-08 MED ORDER — MIDAZOLAM HCL 2 MG/2ML IJ SOLN
INTRAMUSCULAR | Status: DC | PRN
  Administered 2019-02-08: 2 mg via INTRAVENOUS

## 2019-02-08 MED ORDER — PROPOFOL 10 MG/ML IV BOLUS
INTRAVENOUS | Status: DC | PRN
  Administered 2019-02-08: 150 mg via INTRAVENOUS

## 2019-02-08 MED ORDER — LIDOCAINE 2% (20 MG/ML) 5 ML SYRINGE
INTRAMUSCULAR | Status: AC
  Filled 2019-02-08: qty 5

## 2019-02-08 MED ORDER — PROMETHAZINE HCL 25 MG/ML IJ SOLN: 6.2500 mg | INTRAMUSCULAR | Status: DC | PRN

## 2019-02-08 MED ORDER — MEPERIDINE HCL 25 MG/ML IJ SOLN: 6.2500 mg | INTRAMUSCULAR | Status: DC | PRN

## 2019-02-08 MED ORDER — FENTANYL CITRATE (PF) 100 MCG/2ML IJ SOLN
INTRAMUSCULAR | Status: DC | PRN
  Administered 2019-02-08: 100 ug via INTRAVENOUS

## 2019-02-08 MED ORDER — ACETAMINOPHEN 500 MG PO TABS
ORAL_TABLET | ORAL | Status: AC
  Filled 2019-02-08: qty 2

## 2019-02-08 MED ORDER — CHLORHEXIDINE GLUCONATE CLOTH 2 % EX PADS: 6.0000 | MEDICATED_PAD | Freq: Once | CUTANEOUS | Status: DC

## 2019-02-08 MED ORDER — CEFAZOLIN SODIUM-DEXTROSE 2-3 GM-%(50ML) IV SOLR
INTRAVENOUS | Status: DC | PRN
  Administered 2019-02-08: 2 g via INTRAVENOUS

## 2019-02-08 MED ORDER — PHENYLEPHRINE HCL (PRESSORS) 10 MG/ML IV SOLN
INTRAVENOUS | Status: DC | PRN
  Administered 2019-02-08 (×6): 120 ug via INTRAVENOUS

## 2019-02-08 MED ORDER — HYDROMORPHONE HCL 1 MG/ML IJ SOLN: 0.2500 mg | INTRAMUSCULAR | Status: DC | PRN

## 2019-02-08 MED ORDER — ACETAMINOPHEN 500 MG PO TABS
1000.0000 mg | ORAL_TABLET | ORAL | Status: AC
  Administered 2019-02-08: 1000 mg via ORAL

## 2019-02-08 MED ORDER — FENTANYL CITRATE (PF) 100 MCG/2ML IJ SOLN
INTRAMUSCULAR | Status: AC
  Filled 2019-02-08: qty 2

## 2019-02-08 MED ORDER — LACTATED RINGERS IV SOLN
INTRAVENOUS | Status: DC
  Administered 2019-02-08: 08:00:00 via INTRAVENOUS

## 2019-02-08 MED ORDER — SODIUM CHLORIDE 0.9% FLUSH: 3.0000 mL | Freq: Two times a day (BID) | INTRAVENOUS | Status: DC

## 2019-02-08 MED ORDER — MIDAZOLAM HCL 2 MG/2ML IJ SOLN
INTRAMUSCULAR | Status: AC
  Filled 2019-02-08: qty 2

## 2019-02-08 MED ORDER — GABAPENTIN 300 MG PO CAPS
ORAL_CAPSULE | ORAL | Status: AC
  Filled 2019-02-08: qty 1

## 2019-02-08 MED ORDER — DEXAMETHASONE SODIUM PHOSPHATE 10 MG/ML IJ SOLN
INTRAMUSCULAR | Status: AC
  Filled 2019-02-08: qty 1

## 2019-02-08 MED ORDER — CEFAZOLIN SODIUM-DEXTROSE 2-4 GM/100ML-% IV SOLN
INTRAVENOUS | Status: AC
  Filled 2019-02-08: qty 100

## 2019-02-08 MED ORDER — OXYCODONE HCL 5 MG/5ML PO SOLN: 5.0000 mg | Freq: Once | ORAL | Status: DC | PRN

## 2019-02-08 MED ORDER — OXYCODONE HCL 5 MG PO TABS: 5.0000 mg | ORAL_TABLET | Freq: Once | ORAL | Status: DC | PRN

## 2019-02-08 MED ORDER — BUPIVACAINE HCL (PF) 0.5 % IJ SOLN
INTRAMUSCULAR | Status: AC
  Filled 2019-02-08: qty 30

## 2019-02-08 MED ORDER — CEFAZOLIN SODIUM-DEXTROSE 2-4 GM/100ML-% IV SOLN: 2.0000 g | INTRAVENOUS | Status: DC

## 2019-02-08 MED ORDER — BUPIVACAINE HCL (PF) 0.5 % IJ SOLN
INTRAMUSCULAR | Status: DC | PRN
  Administered 2019-02-08: 20 mL

## 2019-02-08 MED ORDER — ONDANSETRON HCL 4 MG/2ML IJ SOLN
INTRAMUSCULAR | Status: DC | PRN
  Administered 2019-02-08: 4 mg via INTRAVENOUS

## 2019-02-08 MED ORDER — PROPOFOL 10 MG/ML IV BOLUS
INTRAVENOUS | Status: AC
  Filled 2019-02-08: qty 40

## 2019-02-08 MED ORDER — CELECOXIB 200 MG PO CAPS
200.0000 mg | ORAL_CAPSULE | ORAL | Status: AC
  Administered 2019-02-08: 200 mg via ORAL

## 2019-02-08 MED ORDER — ONDANSETRON HCL 4 MG/2ML IJ SOLN
INTRAMUSCULAR | Status: AC
  Filled 2019-02-08: qty 2

## 2019-02-08 MED ORDER — HYDROCODONE-ACETAMINOPHEN 5-325 MG PO TABS: 1.0000 | ORAL_TABLET | Freq: Four times a day (QID) | ORAL | 0 refills | Status: AC | PRN

## 2019-02-08 MED FILL — HYDROCODON-APAP 5-325: 5-325 | 3 days supply | Qty: 20 | Fill #0

## 2019-02-08 SURGICAL SUPPLY — 58 items
APPLIER CLIP 9.375 MED OPEN (MISCELLANEOUS)
BENZOIN TINCTURE PRP APPL 2/3 (GAUZE/BANDAGES/DRESSINGS) IMPLANT
BINDER BREAST LRG (GAUZE/BANDAGES/DRESSINGS) IMPLANT
BINDER BREAST MEDIUM (GAUZE/BANDAGES/DRESSINGS) IMPLANT
BINDER BREAST XLRG (GAUZE/BANDAGES/DRESSINGS) IMPLANT
BINDER BREAST XXLRG (GAUZE/BANDAGES/DRESSINGS) ×2 IMPLANT
BLADE HEX COATED 2.75 (ELECTRODE) ×2 IMPLANT
BLADE SURG 10 STRL SS (BLADE) IMPLANT
BLADE SURG 15 STRL LF DISP TIS (BLADE) ×1 IMPLANT
BLADE SURG 15 STRL SS (BLADE) ×1
CANISTER SUC SOCK COL 7IN (MISCELLANEOUS) IMPLANT
CANISTER SUCT 1200ML W/VALVE (MISCELLANEOUS) ×2 IMPLANT
CHLORAPREP W/TINT 26 (MISCELLANEOUS) ×2 IMPLANT
CLIP APPLIE 9.375 MED OPEN (MISCELLANEOUS) IMPLANT
COVER BACK TABLE REUSABLE LG (DRAPES) ×2 IMPLANT
COVER MAYO STAND REUSABLE (DRAPES) ×2 IMPLANT
COVER PROBE W GEL 5X96 (DRAPES) ×2 IMPLANT
COVER WAND RF STERILE (DRAPES) IMPLANT
DECANTER SPIKE VIAL GLASS SM (MISCELLANEOUS) IMPLANT
DERMABOND ADVANCED (GAUZE/BANDAGES/DRESSINGS) ×1
DERMABOND ADVANCED .7 DNX12 (GAUZE/BANDAGES/DRESSINGS) ×1 IMPLANT
DRAPE HALF SHEET 70X43 (DRAPES) IMPLANT
DRAPE LAPAROSCOPIC ABDOMINAL (DRAPES) ×2 IMPLANT
DRAPE UTILITY XL STRL (DRAPES) ×2 IMPLANT
DRSG PAD ABDOMINAL 8X10 ST (GAUZE/BANDAGES/DRESSINGS) ×2 IMPLANT
ELECT REM PT RETURN 9FT ADLT (ELECTROSURGICAL) ×2
ELECTRODE REM PT RTRN 9FT ADLT (ELECTROSURGICAL) ×1 IMPLANT
GAUZE SPONGE 4X4 12PLY STRL LF (GAUZE/BANDAGES/DRESSINGS) ×2 IMPLANT
GLOVE SS BIOGEL STRL SZ 7 (GLOVE) ×2 IMPLANT
GLOVE SUPERSENSE BIOGEL SZ 7 (GLOVE) ×2
GOWN STRL REUS W/ TWL LRG LVL3 (GOWN DISPOSABLE) ×1 IMPLANT
GOWN STRL REUS W/ TWL XL LVL3 (GOWN DISPOSABLE) ×1 IMPLANT
GOWN STRL REUS W/TWL LRG LVL3 (GOWN DISPOSABLE) ×1
GOWN STRL REUS W/TWL XL LVL3 (GOWN DISPOSABLE) ×1
ILLUMINATOR WAVEGUIDE N/F (MISCELLANEOUS) IMPLANT
KIT MARKER MARGIN INK (KITS) ×2 IMPLANT
LIGHT WAVEGUIDE WIDE FLAT (MISCELLANEOUS) IMPLANT
NEEDLE HYPO 25X1 1.5 SAFETY (NEEDLE) ×2 IMPLANT
NS IRRIG 1000ML POUR BTL (IV SOLUTION) ×2 IMPLANT
PACK BASIN DAY SURGERY FS (CUSTOM PROCEDURE TRAY) ×2 IMPLANT
PENCIL SMOKE EVACUATOR (MISCELLANEOUS) ×2 IMPLANT
SLEEVE SCD COMPRESS KNEE MED (MISCELLANEOUS) ×2 IMPLANT
SPONGE LAP 18X18 RF (DISPOSABLE) ×2 IMPLANT
SPONGE LAP 4X18 RFD (DISPOSABLE) ×2 IMPLANT
STRIP CLOSURE SKIN 1/2X4 (GAUZE/BANDAGES/DRESSINGS) IMPLANT
SUT ETHILON 3 0 FSL (SUTURE) IMPLANT
SUT MNCRL AB 4-0 PS2 18 (SUTURE) ×2 IMPLANT
SUT SILK 2 0 SH (SUTURE) ×2 IMPLANT
SUT VIC AB 2-0 CT1 27 (SUTURE)
SUT VIC AB 2-0 CT1 TAPERPNT 27 (SUTURE) IMPLANT
SUT VIC AB 3-0 SH 27 (SUTURE)
SUT VIC AB 3-0 SH 27X BRD (SUTURE) IMPLANT
SUT VICRYL 3-0 CR8 SH (SUTURE) ×4 IMPLANT
SYR 10ML LL (SYRINGE) ×2 IMPLANT
TOWEL GREEN STERILE FF (TOWEL DISPOSABLE) ×2 IMPLANT
TRAY FAXITRON CT DISP (TRAY / TRAY PROCEDURE) ×2 IMPLANT
TUBE CONNECTING 20X1/4 (TUBING) ×2 IMPLANT
YANKAUER SUCT BULB TIP NO VENT (SUCTIONS) ×2 IMPLANT

## 2019-02-08 NOTE — Interval H&P Note (Signed)
History and Physical Interval Note:  02/08/2019 7:33 AM  Elizabeth Roberts  has presented today for surgery, with the diagnosis of RIGHT BREAST DISTORTION.  The various methods of treatment have been discussed with the patient and family. After consideration of risks, benefits and other options for treatment, the patient has consented to  Procedure(s): RIGHT BREAST LUMPECTOMY X 2 WITH 2 RIGHT RADIOACTIVE SEED LOCALIZATION (Right) as a surgical intervention.  The patient's history has been reviewed, patient examined, no change in status, stable for surgery.  I have reviewed the patient's chart and labs.  Questions were answered to the patient's satisfaction.     Adin Hector

## 2019-02-08 NOTE — Anesthesia Preprocedure Evaluation (Signed)
Anesthesia Evaluation  Patient identified by MRN, date of birth, ID band Patient awake    Reviewed: Allergy & Precautions, NPO status , Patient's Chart, lab work & pertinent test results  History of Anesthesia Complications (+) PONV and history of anesthetic complications  Airway Mallampati: II  TM Distance: >3 FB Neck ROM: Full    Dental no notable dental hx. (+) Dental Advisory Given   Pulmonary neg pulmonary ROS,    Pulmonary exam normal breath sounds clear to auscultation       Cardiovascular hypertension, negative cardio ROS Normal cardiovascular exam Rhythm:Regular Rate:Normal     Neuro/Psych negative neurological ROS  negative psych ROS   GI/Hepatic Neg liver ROS, GERD  ,  Endo/Other  negative endocrine ROS  Renal/GU negative Renal ROS     Musculoskeletal negative musculoskeletal ROS (+)   Abdominal (+) + obese,   Peds  Hematology negative hematology ROS (+)   Anesthesia Other Findings   Reproductive/Obstetrics negative OB ROS                             Anesthesia Physical Anesthesia Plan  ASA: II  Anesthesia Plan: General   Post-op Pain Management:    Induction: Intravenous  PONV Risk Score and Plan: 4 or greater and Ondansetron, Dexamethasone, Midazolam, Scopolamine patch - Pre-op and Treatment may vary due to age or medical condition  Airway Management Planned: LMA  Additional Equipment: None  Intra-op Plan:   Post-operative Plan: Extubation in OR  Informed Consent: I have reviewed the patients History and Physical, chart, labs and discussed the procedure including the risks, benefits and alternatives for the proposed anesthesia with the patient or authorized representative who has indicated his/her understanding and acceptance.     Dental advisory given  Plan Discussed with: CRNA  Anesthesia Plan Comments:         Anesthesia Quick Evaluation

## 2019-02-08 NOTE — Op Note (Addendum)
Patient Name:           Elizabeth Roberts   Date of Surgery:        02/08/2019  Pre op Diagnosis:      Abnormal mammogram right breast with discordant biopsy x2  Post op Diagnosis:    Same  Procedure:                 Right breast lumpectomy x2 with radioactive seed localization x2  Surgeon:                     Edsel Petrin. 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 Referred by Dr. Nolon Nations at the [facility] cause of 2 discordant biopsies in the right breast. PCP is Horald Pollen.       In 2003 she underwent 2 left breast biopsies for completely benign process. This was in another state. Otherwise no breast problems. She gets routine screening mammograms.       Recent mammogram showed 2 small areas of distortion in the right breast. Both of these areas were biopsied. In the upper right breast, middle depth, she showed fibrocystic change and this was felt to be discordant In the right breast, upper inner quadrant middle depth this was biopsied and showed normal breast parenchyma. The clip migrated inferiorly a little bit. This was also felt to be discordant. Excision  was recommended of both of these areas. I explained that the risk of upgrade cancer here it is not precise but probably somewhere between 5 and 8%. She does not want to take a chance and wants these areas excised.  She'll be scheduled for right breast lumpectomy 2 with radioactive seed localization 2.She agrees with this plan.  Operative Findings:       The radioactive seeds were present in the superior right breast, several centimeters cephalad to the areolar margin.  They were close enough that we could make a single transverse curvilinear incision.  The specimen mammogram looked good containing both seeds and marker clips in the relative center of the specimen.  There was no  palpable mass.  The breast tissues were very soft and bled easily but hemostasis was excellent at the end of the case  Procedure in Detail:          Following the induction of general LMA anesthesia the patient's right breast was prepped and draped in a sterile fashion.  Surgical timeout was performed.  Intravenous antibiotics were given.  0.5% Marcaine was used as local infiltration anesthetic.     Using the neoprobe I carefully mapped out the area of radioactivity encompassing the 2 seeds.  I designed an incision which was transverse and curvilinear at the superior right breast.  The incision was made with a knife.  The lumpectomy was performed with the neoprobe and electrocautery.  The specimen was removed and marked with silk sutures and a 6 color ink kit.  Specimen mammogram looked good as described above.  The specimen was sent to the lab where both seeds were retrieved.  Hemostasis was excellent.  The wound was irrigated with saline.  5 metal marker clips were placed in the walls of the lumpectomy cavity.  The deeper breast tissues were closed with 2-0  Vicryl.  More superficial breast tissues were closed with 3-0 Vicryl.  The skin was closed with a running subcuticular 4-0 Monocryl and Dermabond.  Dry bandages and a breast binder were placed.  The patient tolerated the procedure well and was taken to PACU in stable condition.  EBL 25 cc or less.  Counts correct.  Complications none.   Addendum: I logged onto the PMP aware website and reviewed her prescription medication history     Garv Kuechle M. Dalbert Batman, M.D., FACS General and Minimally Invasive Surgery Breast and Colorectal Surgery  02/08/2019 9:29 AM

## 2019-02-08 NOTE — Discharge Instructions (Signed)
Post Anesthesia Home Care Instructions  Activity: Get plenty of rest for the remainder of the day. A responsible individual must stay with you for 24 hours following the procedure.  For the next 24 hours, DO NOT: -Drive a car -Advertising copywriter -Drink alcoholic beverages -Take any medication unless instructed by your physician -Make any legal decisions or sign important papers.  Meals: Start with liquid foods such as gelatin or soup. Progress to regular foods as tolerated. Avoid greasy, spicy, heavy foods. If nausea and/or vomiting occur, drink only clear liquids until the nausea and/or vomiting subsides. Call your physician if vomiting continues.  Special Instructions/Symptoms: Your throat may feel dry or sore from the anesthesia or the breathing tube placed in your throat during surgery. If this causes discomfort, gargle with warm salt water. The discomfort should disappear within 24 hours.  If you had a scopolamine patch placed behind your ear for the management of post- operative nausea and/or vomiting:  1. The medication in the patch is effective for 72 hours, after which it should be removed.  Wrap patch in a tissue and discard in the trash. Wash hands thoroughly with soap and water. 2. You may remove the patch earlier than 72 hours if you experience unpleasant side effects which may include dry mouth, dizziness or visual disturbances. 3. Avoid touching the patch. Wash your hands with soap and water after contact with the patch.        [facility] Office Phone Number [phone]  BREAST BIOPSY/ PARTIAL MASTECTOMY: POST OP INSTRUCTIONS  Always review your discharge instruction sheet given to you by the facility where your surgery was performed.  IF YOU HAVE DISABILITY OR FAMILY LEAVE FORMS, YOU MUST BRING THEM TO THE OFFICE FOR PROCESSING.  DO NOT GIVE THEM TO YOUR DOCTOR.  1. A prescription for pain medication may be given to you upon discharge.  Take your  pain medication as prescribed, if needed.  If narcotic pain medicine is not needed, then you may take acetaminophen (Tylenol) or ibuprofen (Advil) as needed. 2. Take your usually prescribed medications unless otherwise directed 3. If you need a refill on your pain medication, please contact your pharmacy.  They will contact our office to request authorization.  Prescriptions will not be filled after 5pm or on week-ends. 4. You should eat very light the first 24 hours after surgery, such as soup, crackers, pudding, etc.  Resume your normal diet the day after surgery. 5. Most patients will experience some swelling and bruising in the breast.  Ice packs and a good support bra will help.  Swelling and bruising can take several days to resolve.  6. It is common to experience some constipation if taking pain medication after surgery.  Increasing fluid intake and taking a stool softener will usually help or prevent this problem from occurring.  A mild laxative (Milk of Magnesia or Miralax) should be taken according to package directions if there are no bowel movements after 48 hours. 7. Unless discharge instructions indicate otherwise, you may remove your bandages 24-48 hours after surgery, and you may shower at that time.  You may have steri-strips (small skin tapes) in place directly over the incision.  These strips should be left on the skin for 7-10 days.  If your surgeon used skin glue on the incision, you may shower in 24 hours.  The glue will flake off over the next 2-3 weeks.  Any sutures or staples will be removed at the office during your follow-up  visit. 8. ACTIVITIES:  You may resume regular daily activities (gradually increasing) beginning the next day.  Wearing a good support bra or sports bra minimizes pain and swelling.  You may have sexual intercourse when it is comfortable. a. You may drive when you no longer are taking prescription pain medication, you can comfortably wear a seatbelt, and you can  safely maneuver your car and apply brakes. b. RETURN TO WORK:  ______________________________________________________________________________________ 9. You should see your doctor in the office for a follow-up appointment approximately two weeks after your surgery.  Your doctors nurse will typically make your follow-up appointment when she calls you with your pathology report.  Expect your pathology report 2-3 business days after your surgery.  You may call to check if you do not hear from [place] after three days. 10. OTHER INSTRUCTIONS: _______________________________________________________________________________________________ _____________________________________________________________________________________________________________________________________ _____________________________________________________________________________________________________________________________________ _____________________________________________________________________________________________________________________________________  WHEN TO CALL YOUR DOCTOR: 1. Fever over 101.0 2. Nausea and/or vomiting. 3. Extreme swelling or bruising. 4. Continued bleeding from incision. 5. Increased pain, redness, or drainage from the incision.  The clinic staff is available to answer your questions during regular business hours.  Please dont hesitate to call and ask to speak to one of the nurses for clinical concerns.  If you have a medical emergency, go to the nearest emergency room or call 911.  A surgeon from [facility] is always on call at the hospital.  For further questions, please visit [email or web address] [employer] Office Phone Number [phone]  BREAST BIOPSY/ PARTIAL MASTECTOMY: POST OP INSTRUCTIONS  Always review your discharge instruction sheet given to you by the facility where your surgery was performed.  IF YOU HAVE DISABILITY OR FAMILY LEAVE FORMS, YOU MUST  BRING THEM TO THE OFFICE FOR PROCESSING.  DO NOT GIVE THEM TO YOUR DOCTOR.  11. A prescription for pain medication may be given to you upon discharge.  Take your pain medication as prescribed, if needed.  If narcotic pain medicine is not needed, then you may take acetaminophen (Tylenol) or ibuprofen (Advil) as needed. 12. Take your usually prescribed medications unless otherwise directed 13. If you need a refill on your pain medication, please contact your pharmacy.  They will contact our office to request authorization.  Prescriptions will not be filled after 5pm or on week-ends. 14. You should eat very light the first 24 hours after surgery, such as soup, crackers, pudding, etc.  Resume your normal diet the day after surgery. 15. Most patients will experience some swelling and bruising in the breast.  Ice packs and a good support bra will help.  Swelling and bruising can take several days to resolve.  16. It is common to experience some constipation if taking pain medication after surgery.  Increasing fluid intake and taking a stool softener will usually help or prevent this problem from occurring.  A mild laxative (Milk of Magnesia or Miralax) should be taken according to package directions if there are no bowel movements after 48 hours. 17. Unless discharge instructions indicate otherwise, you may remove your bandages 24-48 hours after surgery, and you may shower at that time.  You may have steri-strips (small skin tapes) in place directly over the incision.  These strips should be left on the skin for 7-10 days.  If your surgeon used skin glue on the incision, you may shower in 24 hours.  The glue will flake off over the next 2-3 weeks.  Any sutures or staples will be removed at the office during your follow-up visit.  18. ACTIVITIES:  You may resume regular daily activities (gradually increasing) beginning the next day.  Wearing a good support bra or sports bra minimizes pain and swelling.  You may  have sexual intercourse when it is comfortable. a. You may drive when you no longer are taking prescription pain medication, you can comfortably wear a seatbelt, and you can safely maneuver your car and apply brakes. b. RETURN TO WORK:  ______________________________________________________________________________________ 19. You should see your doctor in the office for a follow-up appointment approximately two weeks after your surgery.  Your doctors nurse will typically make your follow-up appointment when she calls you with your pathology report.  Expect your pathology report 2-3 business days after your surgery.  You may call to check if you do not hear from us after three days. 20. OTHER INSTRUCTIONS: _______________________________________________________________________________________________ _____________________________________________________________________________________________________________________________________ _____________________________________________________________________________________________________________________________________ _____________________________________________________________________________________________________________________________________  WHEN TO CALL YOUR DOCTOR: 6. Fever over 101.0 7. Nausea and/or vomiting. 8. Extreme swelling or bruising. 9. Continued bleeding from incision. 10. Increased pain, redness, or drainage from the incision.  The clinic staff is available to answer your questions during regular business hours.  Please dont hesitate to call and ask to speak to one of the nurses for clinical concerns.  If you have a medical emergency, go to the nearest emergency room or call 911.  [facility] always on call at the hospital.  For further questions, please visit [email or web address]                Managing Your Pain After Surgery Without Opioids    Thank you for  participating in our program to help patients manage their pain after surgery without opioids. This is part of our effort to provide you with the best care possible, without exposing you or your family to the risk that opioids pose.  What pain can I expect after surgery? You can expect to have some pain after surgery. This is normal. The pain is typically worse the day after surgery, and quickly begins to get better. Many studies have found that many patients are able to manage their pain after surgery with Over-the-Counter (OTC) medications such as Tylenol and Motrin. If you have a condition that does not allow you to take Tylenol or Motrin, notify your surgical team.  How will I manage my pain? The best strategy for controlling your pain after surgery is around the clock pain control with Tylenol (acetaminophen) and Motrin (ibuprofen or Advil). Alternating these medications with each other allows you to maximize your pain control. In addition to Tylenol and Motrin, you can use heating pads or ice packs on your incisions to help reduce your pain.  How will I alternate your regular strength over-the-counter pain medication? You will take a dose of pain medication every three hours. ; Start by taking 650 mg of Tylenol (2 pills of 325 mg) ; 3 hours later take 600 mg of Motrin (3 pills of 200 mg) ; 3 hours after taking the Motrin take 650 mg of Tylenol ; 3 hours after that take 600 mg of Motrin.   - 1 -  See example - if your first dose of Tylenol is at 12:00 PM   12:00 PM Tylenol 650 mg (2 pills of 325 mg)  3:00 PM Motrin 600 mg (3 pills of 200 mg)  6:00 PM Tylenol 650 mg (2 pills of 325 mg)  9:00 PM Motrin 600 mg (3 pills of 200 mg)  Continue alternating every 3 hours  We recommend that you follow this schedule around-the-clock for at least 3 days after surgery, or until you feel that it is no longer needed. Use the table on the last page of this handout to keep track of the medications  you are taking. Important: Do not take more than 3000mg  of Tylenol or 3200mg  of Motrin in a 24-hour period. Do not take ibuprofen/Motrin if you have a history of bleeding stomach ulcers, severe kidney disease, &/or actively taking a blood thinner  What if I still have pain? If you have pain that is not controlled with the over-the-counter pain medications (Tylenol and Motrin or Advil) you might have what we call breakthrough pain. You will receive a prescription for a small amount of an opioid pain medication such as Oxycodone, Tramadol, or Tylenol with Codeine. Use these opioid pills in the first 24 hours after surgery if you have breakthrough pain. Do not take more than 1 pill every 4-6 hours.  If you still have uncontrolled pain after using all opioid pills, don't hesitate to call our staff using the number provided. We will help make sure you are managing your pain in the best way possible, and if necessary, we can provide a prescription for additional pain medication.   Day 1    Time  Name of Medication Number of pills taken  Amount of Acetaminophen  Pain Level   Comments  AM PM       AM PM       AM PM       AM PM       AM PM       AM PM       AM PM       AM PM       Total Daily amount of Acetaminophen Do not take more than  3,000 mg per day      Day 2    Time  Name of Medication Number of pills taken  Amount of Acetaminophen  Pain Level   Comments  AM PM       AM PM       AM PM       AM PM       AM PM       AM PM       AM PM       AM PM       Total Daily amount of Acetaminophen Do not take more than  3,000 mg per day      Day 3    Time  Name of Medication Number of pills taken  Amount of Acetaminophen  Pain Level   Comments  AM PM       AM PM       AM PM       AM PM          AM PM       AM PM       AM PM       AM PM       Total Daily amount of Acetaminophen Do not take more than  3,000 mg per day      Day 4    Time  Name of  Medication Number of pills taken  Amount of Acetaminophen  Pain Level   Comments  AM PM       AM PM       AM PM       AM PM  AM PM       AM PM       AM PM       AM PM       Total Daily amount of Acetaminophen Do not take more than  3,000 mg per day      Day 5    Time  Name of Medication Number of pills taken  Amount of Acetaminophen  Pain Level   Comments  AM PM       AM PM       AM PM       AM PM       AM PM       AM PM       AM PM       AM PM       Total Daily amount of Acetaminophen Do not take more than  3,000 mg per day       Day 6    Time  Name of Medication Number of pills taken  Amount of Acetaminophen  Pain Level  Comments  AM PM       AM PM       AM PM       AM PM       AM PM       AM PM       AM PM       AM PM       Total Daily amount of Acetaminophen Do not take more than  3,000 mg per day      Day 7    Time  Name of Medication Number of pills taken  Amount of Acetaminophen  Pain Level   Comments  AM PM       AM PM       AM PM       AM PM       AM PM       AM PM       AM PM       AM PM       Total Daily amount of Acetaminophen Do not take more than  3,000 mg per day        For additional information about how and where to safely dispose of unused opioid medications - [email or web address]  Disclaimer: This document contains information and/or instructional materials adapted from [facility] for the typical patient with your condition. It does not replace medical advice from your health care provider because your experience may differ from that of the typical patient. Talk to your health care provider if you have any questions about this document, your condition or your treatment plan. Adapted from [facility]

## 2019-02-08 NOTE — Anesthesia Procedure Notes (Signed)
Procedure Name: LMA Insertion Performed by: Verita Lamb, CRNA Pre-anesthesia Checklist: Patient identified, Emergency Drugs available, Suction available and Patient being monitored Patient Re-evaluated:Patient Re-evaluated prior to induction Oxygen Delivery Method: Circle system utilized Preoxygenation: Pre-oxygenation with 100% oxygen Induction Type: IV induction Ventilation: Mask ventilation without difficulty LMA: LMA inserted LMA Size: 4.0 Number of attempts: 1 Airway Equipment and Method: Bite block Placement Confirmation: positive ETCO2 and breath sounds checked- equal and bilateral Tube secured with: Tape Dental Injury: Teeth and Oropharynx as per pre-operative assessment  Comments: Lips and teeth as preop.  Noted upper lip has hemangioma vs bruise before manipulation of airway

## 2019-02-08 NOTE — Transfer of Care (Signed)
Immediate Anesthesia Transfer of Care Note  Patient: Elizabeth Roberts  Procedure(s) Performed: Right breast seed localized lumpectomy (2 seeds) (Right Breast)  Patient Location: PACU  Anesthesia Type:General  Level of Consciousness: awake, alert  and oriented  Airway & Oxygen Therapy: Patient Spontanous Breathing and Patient connected to face mask oxygen  Post-op Assessment: Report given to RN and Post -op Vital signs reviewed and stable  Post vital signs: Reviewed and stable  Last Vitals:  Vitals Value Taken Time  BP    Temp    Pulse    Resp    SpO2      Last Pain:  Vitals:   02/08/19 0731  TempSrc: Oral  PainSc: 0-No pain      Patients Stated Pain Goal: 3 (00/37/04 8889)  Complications: No apparent anesthesia complications

## 2019-02-09 ENCOUNTER — Encounter (HOSPITAL_BASED_OUTPATIENT_CLINIC_OR_DEPARTMENT_OTHER): Payer: Self-pay | Admitting: General Surgery

## 2019-02-09 NOTE — Anesthesia Postprocedure Evaluation (Signed)
Anesthesia Post Note  Patient: Elizabeth Roberts  Procedure(s) Performed: Right breast seed localized lumpectomy (2 seeds) (Right Breast)     Patient location during evaluation: PACU Anesthesia Type: General Level of consciousness: sedated and patient cooperative Pain management: pain level controlled Vital Signs Assessment: post-procedure vital signs reviewed and stable Respiratory status: spontaneous breathing Cardiovascular status: stable Anesthetic complications: no    Last Vitals:  Vitals:   02/08/19 1020 02/08/19 1025  BP:  119/84  Pulse: 85 87  Resp: 16 16  Temp:  37.1 C  SpO2: 94% 95%    Last Pain:  Vitals:   02/08/19 0731  TempSrc: Oral                 Nolon Nations

## 2019-02-10 LAB — SURGICAL PATHOLOGY

## 2019-02-15 DIAGNOSIS — N393 Stress incontinence (female) (male): Secondary | ICD-10-CM | POA: Diagnosis not present

## 2019-02-15 DIAGNOSIS — R3915 Urgency of urination: Secondary | ICD-10-CM | POA: Diagnosis not present

## 2019-02-20 MED FILL — LISINOPRIL 10 MG TABS: 10 | 90 days supply | Qty: 90 | Fill #0

## 2019-02-27 DIAGNOSIS — M62838 Other muscle spasm: Secondary | ICD-10-CM | POA: Diagnosis not present

## 2019-02-27 DIAGNOSIS — M6281 Muscle weakness (generalized): Secondary | ICD-10-CM | POA: Diagnosis not present

## 2019-02-27 DIAGNOSIS — N393 Stress incontinence (female) (male): Secondary | ICD-10-CM | POA: Diagnosis not present

## 2019-02-27 DIAGNOSIS — M6289 Other specified disorders of muscle: Secondary | ICD-10-CM | POA: Diagnosis not present

## 2019-02-27 DIAGNOSIS — R3915 Urgency of urination: Secondary | ICD-10-CM | POA: Diagnosis not present

## 2019-03-02 DIAGNOSIS — H5213 Myopia, bilateral: Secondary | ICD-10-CM | POA: Diagnosis not present

## 2019-03-03 DIAGNOSIS — N3946 Mixed incontinence: Secondary | ICD-10-CM | POA: Diagnosis not present

## 2019-03-03 DIAGNOSIS — I10 Essential (primary) hypertension: Secondary | ICD-10-CM | POA: Diagnosis not present

## 2019-03-03 DIAGNOSIS — M6281 Muscle weakness (generalized): Secondary | ICD-10-CM | POA: Diagnosis not present

## 2019-03-03 DIAGNOSIS — M6289 Other specified disorders of muscle: Secondary | ICD-10-CM | POA: Diagnosis not present

## 2019-03-03 DIAGNOSIS — L989 Disorder of the skin and subcutaneous tissue, unspecified: Secondary | ICD-10-CM | POA: Diagnosis not present

## 2019-03-03 DIAGNOSIS — M62838 Other muscle spasm: Secondary | ICD-10-CM | POA: Diagnosis not present

## 2019-03-03 DIAGNOSIS — K219 Gastro-esophageal reflux disease without esophagitis: Secondary | ICD-10-CM | POA: Diagnosis not present

## 2019-03-03 DIAGNOSIS — R3915 Urgency of urination: Secondary | ICD-10-CM | POA: Diagnosis not present

## 2019-03-03 MED FILL — clonazePAM 0.5 MG TABS: 0.5 | 30 days supply | Qty: 30 | Fill #0

## 2019-03-03 MED FILL — LOSARTAN POTASSIUM 50 MG TA: 50 | 90 days supply | Qty: 90 | Fill #0

## 2019-03-03 MED FILL — QUETIAPINE FUMARATE 100 MG: 100 | 90 days supply | Qty: 90 | Fill #0

## 2019-03-03 MED FILL — ESOMEPRAZOLE MAG DR 40 MG C: 40 | 30 days supply | Qty: 30 | Fill #0

## 2019-03-06 MED FILL — CYCLOBENZAPRINE 10 MG TAB: 10 | 30 days supply | Qty: 90 | Fill #0

## 2019-03-08 DIAGNOSIS — M6281 Muscle weakness (generalized): Secondary | ICD-10-CM | POA: Diagnosis not present

## 2019-03-08 DIAGNOSIS — N3946 Mixed incontinence: Secondary | ICD-10-CM | POA: Diagnosis not present

## 2019-03-08 DIAGNOSIS — M6289 Other specified disorders of muscle: Secondary | ICD-10-CM | POA: Diagnosis not present

## 2019-03-08 DIAGNOSIS — M62838 Other muscle spasm: Secondary | ICD-10-CM | POA: Diagnosis not present

## 2019-03-08 DIAGNOSIS — R3915 Urgency of urination: Secondary | ICD-10-CM | POA: Diagnosis not present

## 2019-03-13 MED FILL — ALBUTEROL SULFATE HFA 108 (: 108 (90 BAS | 33 days supply | Qty: 9 | Fill #0

## 2019-03-28 DIAGNOSIS — M62838 Other muscle spasm: Secondary | ICD-10-CM | POA: Diagnosis not present

## 2019-03-28 DIAGNOSIS — N3946 Mixed incontinence: Secondary | ICD-10-CM | POA: Diagnosis not present

## 2019-03-28 DIAGNOSIS — M6289 Other specified disorders of muscle: Secondary | ICD-10-CM | POA: Diagnosis not present

## 2019-03-28 DIAGNOSIS — M6281 Muscle weakness (generalized): Secondary | ICD-10-CM | POA: Diagnosis not present

## 2019-04-14 DIAGNOSIS — K219 Gastro-esophageal reflux disease without esophagitis: Secondary | ICD-10-CM | POA: Diagnosis not present

## 2019-04-14 DIAGNOSIS — I809 Phlebitis and thrombophlebitis of unspecified site: Secondary | ICD-10-CM | POA: Diagnosis not present

## 2019-04-14 DIAGNOSIS — L989 Disorder of the skin and subcutaneous tissue, unspecified: Secondary | ICD-10-CM | POA: Diagnosis not present

## 2019-04-14 DIAGNOSIS — I10 Essential (primary) hypertension: Secondary | ICD-10-CM | POA: Diagnosis not present

## 2019-04-14 MED FILL — clonazePAM 0.5 MG TABS: 0.5 | 30 days supply | Qty: 30 | Fill #0

## 2019-04-14 MED FILL — FUROSEMIDE 20 MG TABS: 20 | 30 days supply | Qty: 30 | Fill #0

## 2019-04-14 MED FILL — CYCLOBENZAPRINE 10 MG TAB: 10 | 30 days supply | Qty: 90 | Fill #0

## 2019-04-20 ENCOUNTER — Other Ambulatory Visit (HOSPITAL_COMMUNITY): Payer: Self-pay | Admitting: Family Medicine

## 2019-04-20 ENCOUNTER — Encounter (HOSPITAL_COMMUNITY): Payer: Self-pay | Admitting: Family Medicine

## 2019-04-20 DIAGNOSIS — M7989 Other specified soft tissue disorders: Secondary | ICD-10-CM

## 2019-04-20 DIAGNOSIS — M79662 Pain in left lower leg: Secondary | ICD-10-CM

## 2019-04-21 ENCOUNTER — Other Ambulatory Visit: Payer: Self-pay

## 2019-04-21 ENCOUNTER — Ambulatory Visit (HOSPITAL_COMMUNITY)
Admission: RE | Admit: 2019-04-21 | Discharge: 2019-04-21 | Disposition: A | Discharge status: Home / Self Care | Payer: 59 | Source: Ambulatory Visit | Attending: Internal Medicine | Admitting: Internal Medicine

## 2019-04-21 DIAGNOSIS — M7989 Other specified soft tissue disorders: Secondary | ICD-10-CM | POA: Diagnosis not present

## 2019-04-21 DIAGNOSIS — M79662 Pain in left lower leg: Secondary | ICD-10-CM | POA: Insufficient documentation

## 2019-05-15 MED FILL — CITALOPRAM HBR 20 MG TABLET: 20 | 90 days supply | Qty: 90 | Fill #1

## 2019-06-09 MED FILL — CYCLOBENZAPRINE HCL 10 MG T: 10 | 30 days supply | Qty: 90 | Fill #0

## 2019-06-15 MED FILL — LOSARTAN POTASSIUM 50 MG TA: 50 | 90 days supply | Qty: 90 | Fill #0

## 2019-07-04 MED FILL — FUROSEMIDE 20 MG TABS: 20 | 30 days supply | Qty: 30 | Fill #0

## 2019-07-09 DIAGNOSIS — Z1159 Encounter for screening for other viral diseases: Secondary | ICD-10-CM | POA: Diagnosis not present

## 2019-07-12 DIAGNOSIS — L989 Disorder of the skin and subcutaneous tissue, unspecified: Secondary | ICD-10-CM | POA: Diagnosis not present

## 2019-07-12 DIAGNOSIS — I10 Essential (primary) hypertension: Secondary | ICD-10-CM | POA: Diagnosis not present

## 2019-07-12 DIAGNOSIS — F39 Unspecified mood [affective] disorder: Secondary | ICD-10-CM | POA: Diagnosis not present

## 2019-07-12 DIAGNOSIS — I809 Phlebitis and thrombophlebitis of unspecified site: Secondary | ICD-10-CM | POA: Diagnosis not present

## 2019-07-12 MED FILL — FLUCONAZOLE 150 MG TABLET: 150 | 9 days supply | Qty: 3 | Fill #0

## 2019-07-12 MED FILL — QUETIAPINE FUMARATE 100 MG: 100 | 90 days supply | Qty: 90 | Fill #0

## 2019-07-12 MED FILL — NITROFURANTOIN MONO-MCR 100: 100 | 7 days supply | Qty: 14 | Fill #0

## 2019-07-21 MED FILL — CYCLOBENZAPRINE HCL 10 MG T: 10 | 30 days supply | Qty: 90 | Fill #0

## 2019-07-21 MED FILL — FUROSEMIDE 20 MG TABS: 20 | 30 days supply | Qty: 30 | Fill #0

## 2019-09-20 DIAGNOSIS — I10 Essential (primary) hypertension: Secondary | ICD-10-CM | POA: Diagnosis not present

## 2019-09-20 DIAGNOSIS — L989 Disorder of the skin and subcutaneous tissue, unspecified: Secondary | ICD-10-CM | POA: Diagnosis not present

## 2019-09-20 DIAGNOSIS — Z139 Encounter for screening, unspecified: Secondary | ICD-10-CM | POA: Diagnosis not present

## 2019-09-20 DIAGNOSIS — Z1329 Encounter for screening for other suspected endocrine disorder: Secondary | ICD-10-CM | POA: Diagnosis not present

## 2019-09-20 DIAGNOSIS — I809 Phlebitis and thrombophlebitis of unspecified site: Secondary | ICD-10-CM | POA: Diagnosis not present

## 2019-09-20 DIAGNOSIS — Z0000 Encounter for general adult medical examination without abnormal findings: Secondary | ICD-10-CM | POA: Diagnosis not present

## 2019-09-20 DIAGNOSIS — Z124 Encounter for screening for malignant neoplasm of cervix: Secondary | ICD-10-CM | POA: Diagnosis not present

## 2019-09-20 MED FILL — FUROSEMIDE 20 MG TABS: 20 | 90 days supply | Qty: 90 | Fill #0

## 2019-09-20 MED FILL — clonazePAM 0.5 MG TABS: 0.5 | 20 days supply | Qty: 20 | Fill #0

## 2019-09-20 MED FILL — CYCLOBENZAPRINE HCL 10 MG T: 10 | 30 days supply | Qty: 90 | Fill #0

## 2019-09-20 MED FILL — LOSARTAN POTASSIUM 50 MG TA: 50 | 90 days supply | Qty: 90 | Fill #0

## 2019-09-28 MED FILL — LEVOTHYROXINE SODIUM 25 MCG: 25 | 30 days supply | Qty: 30 | Fill #0

## 2019-10-13 MED FILL — CITALOPRAM HBR 20 MG TABLET: 20 | 90 days supply | Qty: 90 | Fill #0

## 2019-11-08 MED FILL — CYCLOBENZAPRINE HCL 10 MG T: 10 | 30 days supply | Qty: 90 | Fill #0

## 2019-11-20 MED FILL — clonazePAM 0.5 MG TABS: 0.5 | 30 days supply | Qty: 30 | Fill #0

## 2019-11-30 MED FILL — clonazePAM 0.5 MG TABS: 0.5 | 30 days supply | Qty: 30 | Fill #0

## 2020-02-25 IMAGING — MG MM BREAST BX W/ LOC DEV EA AD LESION IMAG BX SPEC STEREO GUIDE*R
6 series · 6 of 6 positions shown · non-contrast
Comparison: Previous exams.
COMPARISON: Previous exams.

Addendum:
CLINICAL DATA: 63-year-old with a screening detected mass involving
the INNER RIGHT breast at MIDDLE to POSTERIOR depth which was most
conspicuous on the CC tomosynthesis images. On diagnostic spot
compression CC images, possible subtle architectural distortion was
identified approximately 2 cm LATERAL to the mass. Stereotactic core
needle biopsy of the mass and the possible subtle architectural
distortion is performed.

EXAM:
RIGHT BREAST STEREOTACTIC CORE NEEDLE BIOPSY x 2

[R (1 of 6)]
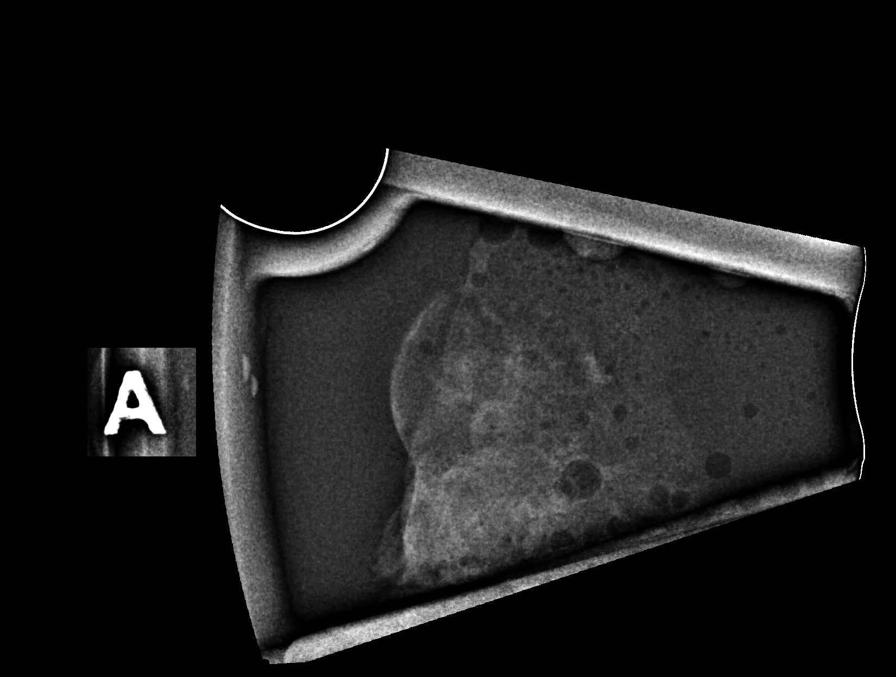

[R (2 of 6)]
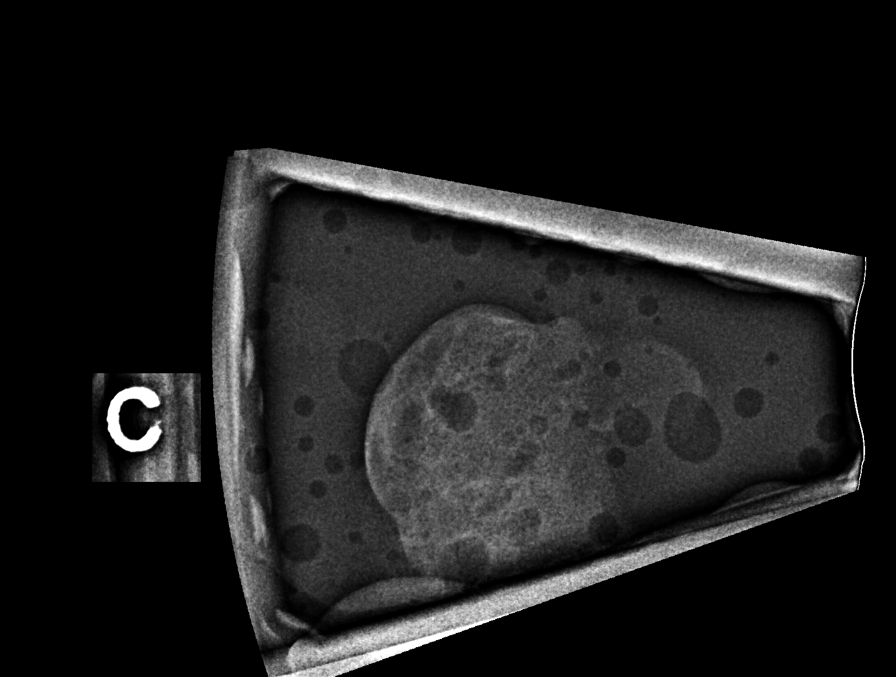

[R (3 of 6)]
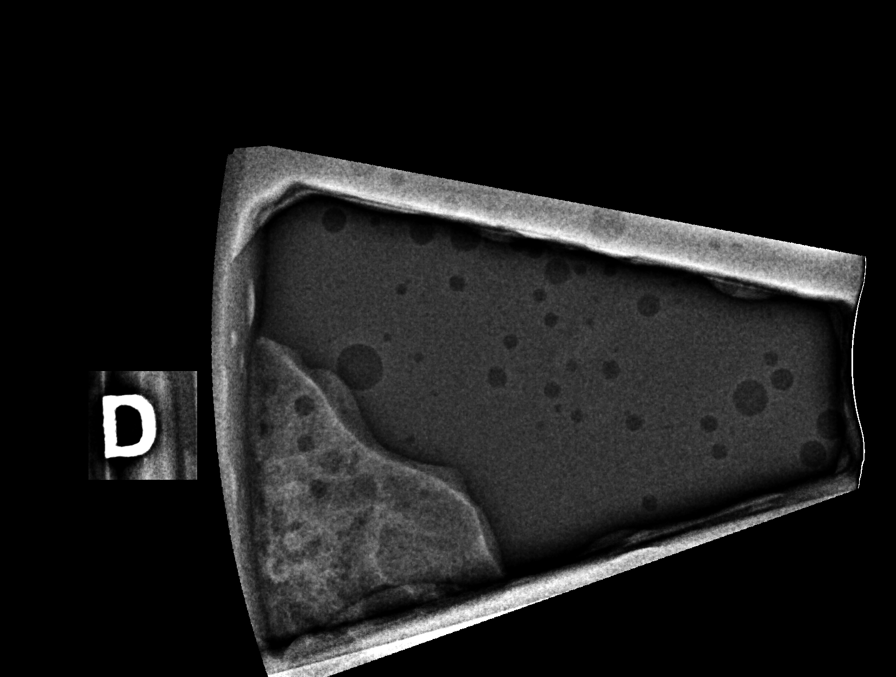

[R (4 of 6)]
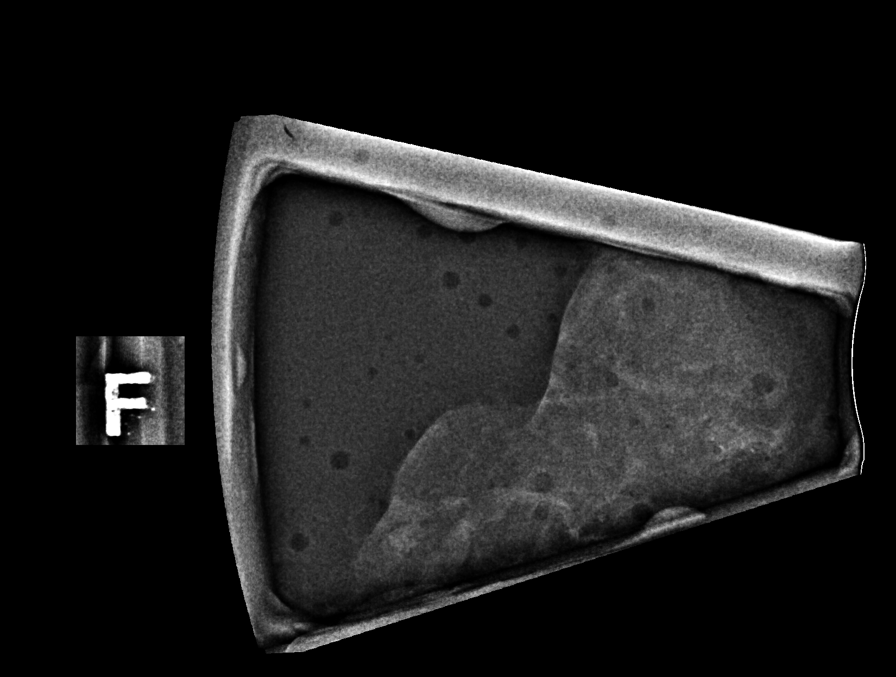

[R (5 of 6)]
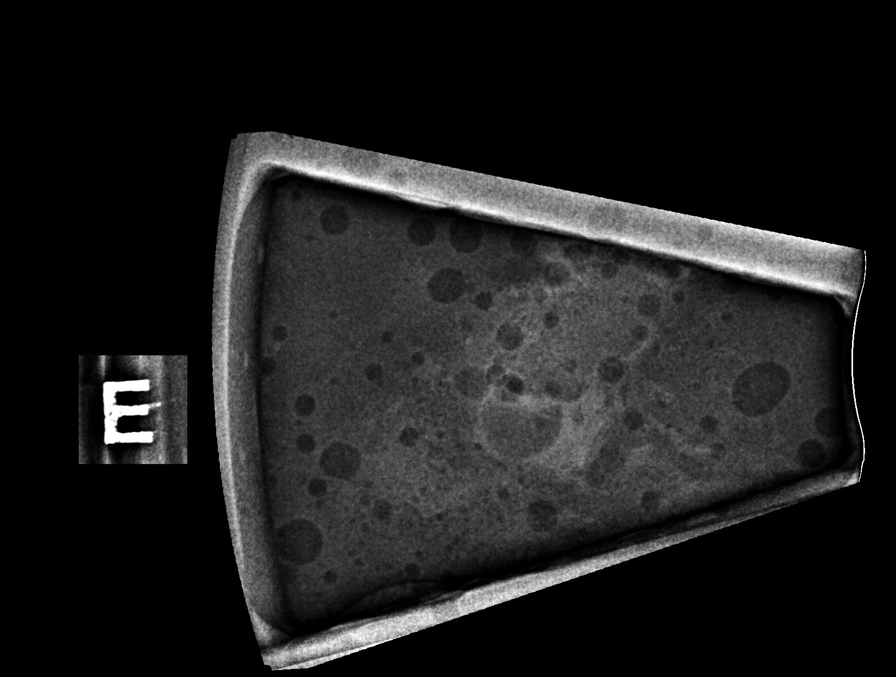

[R (6 of 6)]
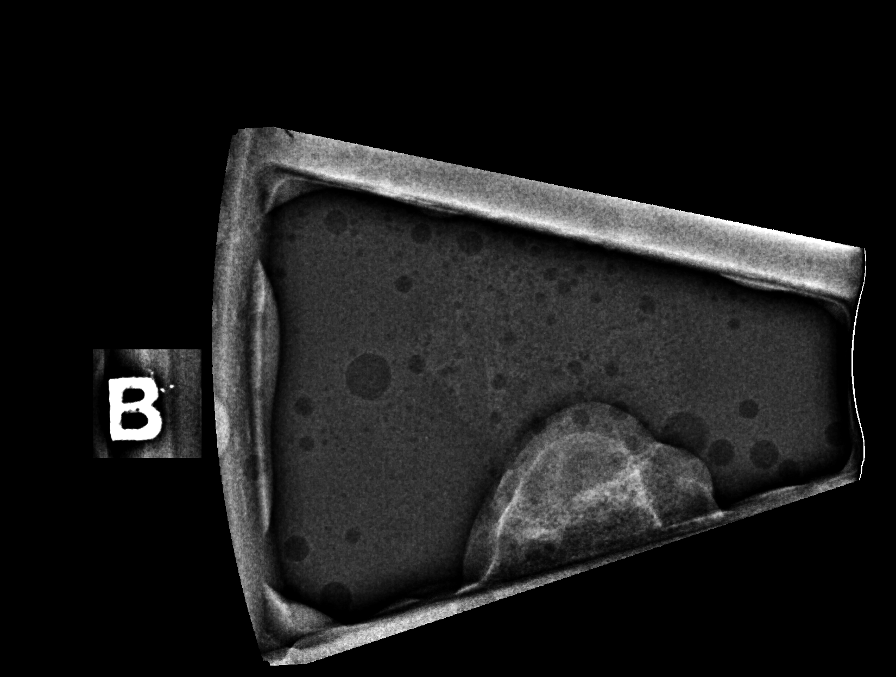

[6 of 6 positions shown; findings below may reference images not displayed]



# 1) Distortion: UPPER BREAST AT MIDDLE DEPTH.

Using sterile technique with chlorhexidine as skin antisepsis, 1%
lidocaine and 1% lidocaine with epinephrine as local anesthetic,
under stereotactic guidance, a 9 gauge Brevera vacuum assisted
device was used to perform core needle biopsy of the architectural
distortion involving the UPPER RIGHT breast at MIDDLE depth using a
superior approach. Specimen radiograph was performed showing soft
tissue density in multiple core samples. One of the samples has
faint calcifications, and review of the diagnostic images
demonstrates faint calcifications at the site of the distortion. At
the conclusion of the procedure, a coil shaped tissue marker clip
was deployed into the biopsy cavity.

# 2) Mass: UPPER INNER QUADRANT.

Again using sterile technique with chlorhexidine as skin antisepsis,
1% lidocaine and 1% lidocaine with epinephrine as local anesthetic,
under stereotactic guidance, a 9 gauge Brevera vacuum assisted
device was used to perform core needle biopsy of the mass involving
the UPPER INNER RIGHT breast at MIDDLE depth using a superior
approach. Specimen radiograph was performed showing soft tissue
density in multiple core samples and likely the edge of the mass in
one of the samples. At the conclusion of the procedure, an X shaped
tissue marker clip was deployed into the biopsy cavity.

Follow-up 2-view mammogram was performed and dictated separately.
IMPRESSION: Stereotactic-guided biopsy of subtle architectural distortion
involving the UPPER RIGHT breast at MIDDLE depth and a separate mass
involving the UPPER INNER QUADRANT of the RIGHT breast at MIDDLE
depth.

The patient developed a small hematoma at the site of the biopsy of
the small mass in the UPPER INNER QUADRANT.

ADDENDUM:
Pathology revealed FIBROCYSTIC CHANGES of the Right breast, upper at
middle depth, distortion. This was found to be likely discordant by
Dr. Nandita Griego, with consideration for excision of the subtle
distortion recommended.

Pathology revealed ESSENTIALLY UNREMARKABLE BREAST PARENCHYMA of the
Right breast, upper inner quadrant at middle depth; mass;
approximately 2 cm medial to the distortion. This was found to be
likely discordant by Dr. Nandita Griego, though the diagnostic
ultrasound showed that the mass has an appearance compatible with
likely benign clustered cysts.

Pathology results were discussed with the patient by telephone. The
patient reported doing well after the biopsies with tenderness at
the sites. Post biopsy instructions and care were reviewed and
questions were answered. The patient was encouraged to call The

Surgical consultation has been arranged with Dr. Herikaryanto Cutenk at
[[email or web address]] on December 14, 2018.

The patient was asked to return for a Right breast ultrasound, upper
inner quadrant at middle depth, in 6 months, unless the mass which
is 2 cm medial to the subtle distortion is surgically removed.

Pathology results reported by Rambo Rano, RN on 12/06/2018.



# 1) Distortion: UPPER BREAST AT MIDDLE DEPTH.

Using sterile technique with chlorhexidine as skin antisepsis, 1%
lidocaine and 1% lidocaine with epinephrine as local anesthetic,
under stereotactic guidance, a 9 gauge Brevera vacuum assisted
device was used to perform core needle biopsy of the architectural
distortion involving the UPPER RIGHT breast at MIDDLE depth using a
superior approach. Specimen radiograph was performed showing soft
tissue density in multiple core samples. One of the samples has
faint calcifications, and review of the diagnostic images
demonstrates faint calcifications at the site of the distortion. At
the conclusion of the procedure, a coil shaped tissue marker clip
was deployed into the biopsy cavity.

# 2) Mass: UPPER INNER QUADRANT.

Again using sterile technique with chlorhexidine as skin antisepsis,
1% lidocaine and 1% lidocaine with epinephrine as local anesthetic,
under stereotactic guidance, a 9 gauge Brevera vacuum assisted
device was used to perform core needle biopsy of the mass involving
the UPPER INNER RIGHT breast at MIDDLE depth using a superior
approach. Specimen radiograph was performed showing soft tissue
density in multiple core samples and likely the edge of the mass in
one of the samples. At the conclusion of the procedure, an X shaped
tissue marker clip was deployed into the biopsy cavity.

Follow-up 2-view mammogram was performed and dictated separately.
IMPRESSION: Stereotactic-guided biopsy of subtle architectural distortion
involving the UPPER RIGHT breast at MIDDLE depth and a separate mass
involving the UPPER INNER QUADRANT of the RIGHT breast at MIDDLE
depth.

The patient developed a small hematoma at the site of the biopsy of
the small mass in the UPPER INNER QUADRANT.

## 2021-09-30 ENCOUNTER — Other Ambulatory Visit: Payer: Self-pay | Admitting: Family Medicine

## 2021-09-30 DIAGNOSIS — Z1231 Encounter for screening mammogram for malignant neoplasm of breast: Secondary | ICD-10-CM

## 2022-08-03 NOTE — Therapy (Signed)
OUTPATIENT PHYSICAL THERAPY LOWER EXTREMITY EVALUATION   Patient Name: Elizabeth Roberts MRN: [id number] DOB:[birth year], 67 y.o., female Today's Date: 08/05/2022  END OF SESSION:  PT End of Session - 08/05/22 1457     Visit Number 1    Number of Visits 17    Date for PT Re-Evaluation 09/30/22    Authorization Type aetna medicare    Authorization Time Period no auth, kx mod visit 15    Progress Note Due on Visit 10    PT Start Time 1459    PT Stop Time 1546    PT Time Calculation (min) 47 min    Activity Tolerance Patient tolerated treatment well;No increased pain    Behavior During Therapy [facility] for tasks assessed/performed             Past Medical History:  Diagnosis Date   Abnormality of right breast on screening mammogram 02/08/2019   Anxiety    GERD (gastroesophageal reflux disease)    no meds   Hypertension    PONV (postoperative nausea and vomiting)    TMJ (dislocation of temporomandibular joint)    Past Surgical History:  Procedure Laterality Date   arm surgery     BREAST LUMPECTOMY WITH RADIOACTIVE SEED LOCALIZATION Right 02/08/2019   Procedure: Right breast seed localized lumpectomy (2 seeds);  Surgeon: Claud Kelp, MD;  Location: [facility];  Service: General;  Laterality: Right;   BREAST SURGERY Left    calcified tissue lumpectomy   MYOMECTOMY  1998   SHOULDER SURGERY     Patient Active Problem List   Diagnosis Date Noted   Abnormality of right breast on screening mammogram 02/08/2019    PCP: Dois Davenport, MD  REFERRING PROVIDER: Delfin Gant, MD  REFERRING DIAG: [phone] (ICD-10-CM) - Pain in left hip  THERAPY DIAG:  Pain in left hip  Muscle weakness (generalized)  Stiffness of left hip, not elsewhere classified  Rationale for Evaluation and Treatment: Rehabilitation  ONSET DATE: a couple months   SUBJECTIVE:   SUBJECTIVE STATEMENT: Pt states two years ago she had a couple of falls on her L knee, had some pain  that has since mostly resolved but feels it has altered her mechanics. States that a couple of months ago she developed L hip pain that has improved some - cannot recall any change in activity or precipitating factors. Got some massage therapy which provided temporary relief. States she got unremarkable XR, was told next steps are PT and if that is not helpful they can do MRI to look at next options. No N/T or referral  PERTINENT HISTORY: GERD, anxiety, HTN, hx of lumpectomies for mass removal (noncancerous per pt) PAIN:  Are you having pain: no Location/description: L hip, laterally  Best-worst over past week: 0-6/10  - aggravating factors: first waking (stiffness), fatigue after work, after standing/walking  - Easing factors: massage, heat, aleve, movement  PRECAUTIONS: None  WEIGHT BEARING RESTRICTIONS: No  FALLS:  Has patient fallen in last 6 months? No    LIVING ENVIRONMENT: 1 story house, 3STE vs 10STE w rail Lives w/ husband Household duties split between pt and spouse   OCCUPATION: nurse at [employer]   PLOF: Independent  PATIENT GOALS: build some strength  NEXT MD VISIT: TBD, follow up after PT   OBJECTIVE:   DIAGNOSTIC FINDINGS:  No recent imaging in EPIC; Per pt, L hip XR ~4 weeks ago, states it was unremarkable   PATIENT SURVEYS:  FOTO 69 current, 76 predicted  COGNITION: Overall cognitive status: Within functional limits for tasks assessed     SENSATION: LT intact B LE   PALPATION: Deferred on eval  LOWER EXTREMITY ROM:     Active  Right eval Left eval  Hip flexion    Hip extension    Hip internal rotation    Hip external rotation    Knee extension    Knee flexion    (Blank rows = not tested) (Key: WFL = within functional limits not formally assessed, * = concordant pain, s = stiffness/stretching sensation, NT = not tested)  Comments:  Passively WNL RLE, pain with ~5 deg of ER/IR passively on LLE   LOWER EXTREMITY MMT:    MMT Right eval  Left eval  Hip flexion 4+ 4  Hip abduction (modified sitting) 5 5  Hip internal rotation 5 4 *  Hip external rotation 5 4-  Knee flexion 5 5  Knee extension 5 5  Ankle dorsiflexion 5 5   (Blank rows = not tested) (Key: WFL = within functional limits not formally assessed, * = concordant pain, s = stiffness/stretching sensation, NT = not tested)  Comments:    LOWER EXTREMITY SPECIAL TESTS:  FADDIR: + LLE ; negative RLE  FABER: unable to achieve testing position due to pain on LLE  Hip distraction: relief LLE AP mob: relief LLE   FUNCTIONAL TESTS:  5xSTS: 12.18sec minimal UE support at thighs, mild hip/knee discomfort  (noted increased L knee valgus and hip IR)  GAIT: Distance walked: within clinic Assistive device utilized: None Level of assistance: Complete Independence Comments: grossly WNL aside from mildly increased IR on LLE    TODAY'S TREATMENT:                                                                                                                              [facility] Adult PT Treatment:                                                DATE: 08/05/22 Therapeutic Exercise: Seated hip adduction iso x10 Standing RTB hip 45 deg kickback x8 HEP handout + education   PATIENT EDUCATION:  Education details: Pt education on PT impairments, prognosis, and POC. Informed consent. Rationale for interventions, safe/appropriate HEP performance Person educated: Patient Education method: Explanation, Demonstration, Tactile cues, Verbal cues, and Handouts Education comprehension: verbalized understanding, returned demonstration, verbal cues required, tactile cues required, and needs further education    HOME EXERCISE PROGRAM: Access Code: [email or web address] URL: [email or web address] Date: 08/05/2022 Prepared by: [name]  Exercises - Seated Hip Adduction Isometrics with Newman Pies  - 1 x daily - 7 x weekly - 3 sets - 10 reps - Standing Hip Abduction with Resistance at  Ankles and Counter Support  - 1 x daily - 7 x weekly - 3 sets -  8 reps  ASSESSMENT:  CLINICAL IMPRESSION: Pt is a pleasant 67 year old woman who arrives to PT evaluation on this date for L hip pain ongoing for past couple of months. Pt denies any overt limitations due to her pain but does endorse increased pain with prolonged activity, morning stiffness and stiffness after activity. 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 During today's session pt demonstrates limitations in L hip mobility/strength and altered mechanics w/ 5xSTS which are likely contributing to her symptoms and reduced activity tolerance. Recommend skilled PT to address aforementioned deficits to improve functional tolerance. No adverse events, denies any increase in symptoms on departure. Pt departs today's session in no acute distress, all voiced questions/concerns addressed appropriately from PT perspective.    OBJECTIVE IMPAIRMENTS: Abnormal gait, decreased activity tolerance, decreased endurance, decreased mobility, difficulty walking, decreased ROM, decreased strength, improper body mechanics, postural dysfunction, and pain.   ACTIVITY LIMITATIONS: sitting, standing, squatting, transfers, and locomotion level  PARTICIPATION LIMITATIONS: occupation  PERSONAL FACTORS: Age, Time since onset of injury/illness/exacerbation, and 1-2 comorbidities: HTN, anxiety  are also affecting patient's functional outcome.   REHAB POTENTIAL: Good  CLINICAL DECISION MAKING: Stable/uncomplicated  EVALUATION COMPLEXITY: Low   GOALS: Goals reviewed with patient? No  SHORT TERM GOALS: Target date: 09/02/2022 Pt will demonstrate appropriate understanding and performance of initially prescribed HEP in order to facilitate improved independence with management of symptoms.  Baseline: HEP provided on eval Goal status: INITIAL   2. Pt will score greater than or equal to 73 on FOTO in order to demonstrate improved perception  of function due to symptoms.  Baseline: 69  Goal status: INITIAL    LONG TERM GOALS: Target date: 09/30/2022 Pt will score 76 or greater on FOTO in order to demonstrate improved perception of function due to symptoms.  Baseline: 69 Goal status: INITIAL  2.  Pt will demonstrate grossly symmetrical hip ER/IR passively without pain in order to improve functional ROM with transfers. Baseline: limited/painful IR/ER L hip compared to R Goal status: INITIAL  3.  Pt will report at least 50% decrease in overall pain levels in past week in order to facilitate improved tolerance to basic ADLs/mobility.   Baseline: 0-5/10  Goal status: INITIAL    4.  Pt will be able to perform 5xSTS in less than or equal to 10sec without UE support in order to demonstrate reduced fall risk and improved functional independence (MCID 5xSTS = 2.3 sec). Baseline: 12 sec min UE support Goal status: INITIAL   5. Pt will demonstrate at least 4+/5 hip ER/IR MMT on LLE in order to facilitate improved functional mechanics.   Baseline: see MMT chart above  Goal status: INITIAL     PLAN:  PT FREQUENCY: 1-2x/week  PT DURATION: 8 weeks  PLANNED INTERVENTIONS: Therapeutic exercises, Therapeutic activity, Neuromuscular re-education, Balance training, Gait training, Patient/Family education, Self Care, Joint mobilization, Aquatic Therapy, Dry Needling, Spinal mobilization, Cryotherapy, Moist heat, Taping, Manual therapy, and Re-evaluation  PLAN FOR NEXT SESSION: review/update HEP PRN. Emphasis on rotational hip strength, closed chain stability, hip mobility. Manual PRN as indicated.     Ashley Murrain PT, DPT 08/05/2022 3:48 PM

## 2022-08-05 ENCOUNTER — Ambulatory Visit: Payer: Medicare HMO | Attending: Sports Medicine | Admitting: Physical Therapy

## 2022-08-05 ENCOUNTER — Encounter: Payer: Self-pay | Admitting: Physical Therapy

## 2022-08-05 ENCOUNTER — Other Ambulatory Visit: Payer: Self-pay

## 2022-08-05 DIAGNOSIS — M25652 Stiffness of left hip, not elsewhere classified: Secondary | ICD-10-CM | POA: Insufficient documentation

## 2022-08-05 DIAGNOSIS — M25552 Pain in left hip: Secondary | ICD-10-CM | POA: Diagnosis present

## 2022-08-05 DIAGNOSIS — M6281 Muscle weakness (generalized): Secondary | ICD-10-CM | POA: Diagnosis present

## 2022-08-20 ENCOUNTER — Ambulatory Visit: Payer: Medicare HMO | Admitting: Physical Therapy

## 2022-08-27 ENCOUNTER — Ambulatory Visit: Payer: Medicare HMO | Attending: Sports Medicine

## 2022-08-27 DIAGNOSIS — M25552 Pain in left hip: Secondary | ICD-10-CM | POA: Diagnosis present

## 2022-08-27 DIAGNOSIS — M6281 Muscle weakness (generalized): Secondary | ICD-10-CM | POA: Diagnosis present

## 2022-08-27 DIAGNOSIS — M25652 Stiffness of left hip, not elsewhere classified: Secondary | ICD-10-CM | POA: Insufficient documentation

## 2022-08-27 NOTE — Therapy (Signed)
OUTPATIENT PHYSICAL THERAPY TREATMENT NOTE   Patient Name: Elizabeth Roberts MRN: [id number] DOB:1955/10/27, 67 y.o., female Today's Date: 08/27/2022  PCP: Dois Davenport, MD   REFERRING PROVIDER: Delfin Gant, MD   END OF SESSION:   PT End of Session - 08/27/22 1421     Visit Number 2    Number of Visits 17    Date for PT Re-Evaluation 09/30/22    Authorization Type aetna medicare    Authorization Time Period no auth, kx mod visit 15    Progress Note Due on Visit 10    PT Start Time 1332    PT Stop Time 1415    PT Time Calculation (min) 43 min    Activity Tolerance Patient tolerated treatment well;No increased pain    Behavior During Therapy [facility] for tasks assessed/performed             Past Medical History:  Diagnosis Date   Abnormality of right breast on screening mammogram 02/08/2019   Anxiety    GERD (gastroesophageal reflux disease)    no meds   Hypertension    PONV (postoperative nausea and vomiting)    TMJ (dislocation of temporomandibular joint)    Past Surgical History:  Procedure Laterality Date   arm surgery     BREAST LUMPECTOMY WITH RADIOACTIVE SEED LOCALIZATION Right 02/08/2019   Procedure: Right breast seed localized lumpectomy (2 seeds);  Surgeon: Claud Kelp, MD;  Location: [facility];  Service: General;  Laterality: Right;   BREAST SURGERY Left    calcified tissue lumpectomy   MYOMECTOMY  1998   SHOULDER SURGERY     Patient Active Problem List   Diagnosis Date Noted   Abnormality of right breast on screening mammogram 02/08/2019    REFERRING DIAG: M25.552 (ICD-10-CM) - Pain in left hip   THERAPY DIAG:  Pain in left hip  Muscle weakness (generalized)  Stiffness of left hip, not elsewhere classified  Rationale for Evaluation and Treatment Rehabilitation  ONSET DATE: a couple months    SUBJECTIVE:    SUBJECTIVE STATEMENT: Pt reports her L hip is feeling a little better. It feels a little stiff today. Pt notes  she is completing her HEP consistently.   PERTINENT HISTORY: GERD, anxiety, HTN, hx of lumpectomies for mass removal (noncancerous per pt) PAIN:  Are you having pain: no Location/description: L hip, laterally  Best-worst over past week: 0-6/10  - aggravating factors: first waking (stiffness), fatigue after work, after standing/walking  - Easing factors: massage, heat, aleve, movement   PRECAUTIONS: None   WEIGHT BEARING RESTRICTIONS: No   FALLS:  Has patient fallen in last 6 months? No     LIVING ENVIRONMENT: 1 story house, 3STE vs 10STE w rail Lives w/ husband Household duties split between pt and spouse     OCCUPATION: nurse at [employer]    PLOF: Independent   PATIENT GOALS: build some strength   NEXT MD VISIT: TBD, follow up after PT    OBJECTIVE: (objective measures completed at initial evaluation unless otherwise dated)   DIAGNOSTIC FINDINGS:  No recent imaging in EPIC; Per pt, L hip XR ~4 weeks ago, states it was unremarkable    PATIENT SURVEYS:  FOTO 69 current, 76 predicted    COGNITION: Overall cognitive status: Within functional limits for tasks assessed                         SENSATION: LT intact B LE  PALPATION: Deferred on eval   LOWER EXTREMITY ROM:      Active  Right eval Left eval  Hip flexion      Hip extension      Hip internal rotation      Hip external rotation      Knee extension      Knee flexion      (Blank rows = not tested) (Key: WFL = within functional limits not formally assessed, * = concordant pain, s = stiffness/stretching sensation, NT = not tested)  Comments:  Passively WNL RLE, pain with ~5 deg of ER/IR passively on LLE    LOWER EXTREMITY MMT:     MMT Right eval Left eval  Hip flexion 4+ 4  Hip abduction (modified sitting) 5 5  Hip internal rotation 5 4 *  Hip external rotation 5 4-  Knee flexion 5 5  Knee extension 5 5  Ankle dorsiflexion 5 5    (Blank rows = not tested) (Key: WFL = within functional limits  not formally assessed, * = concordant pain, s = stiffness/stretching sensation, NT = not tested)  Comments:     LOWER EXTREMITY SPECIAL TESTS:  FADDIR: + LLE ; negative RLE  FABER: unable to achieve testing position due to pain on LLE  Hip distraction: relief LLE AP mob: relief LLE    FUNCTIONAL TESTS:  5xSTS: 12.18sec minimal UE support at thighs, mild hip/knee discomfort  (noted increased L knee valgus and hip IR)   GAIT: Distance walked: within clinic Assistive device utilized: None Level of assistance: Complete Independence Comments: grossly WNL aside from mildly increased IR on LLE      TODAY'S TREATMENT:  [facility] Adult PT Treatment:                                                DATE: 08/27/22 Therapeutic Exercise: Nustep 5 mins SKTC 1x 15" each Pirifomis stretch 2x15" each Figure 4 stretch 2x15" each Seated hip adduction iso x10 Standing RTB hip 45 deg kickback x10 each Standing RTB hip abd x10 each Single leg stands  x2 20" each HEP update + education                                                                                                                              [facility] Adult PT Treatment:                                                DATE: 08/05/22 Therapeutic Exercise: Seated hip adduction iso x10 Standing RTB hip 45 deg kickback x8 HEP handout + education     PATIENT EDUCATION:  Education details: Pt education on PT impairments, prognosis,  and POC. Informed consent. Rationale for interventions, safe/appropriate HEP performance Person educated: Patient Education method: Explanation, Demonstration, Tactile cues, Verbal cues, and Handouts Education comprehension: verbalized understanding, returned demonstration, verbal cues required, tactile cues required, and needs further education     HOME EXERCISE PROGRAM: Access Code: [email or web address] URL: [email or web address] Date: 08/27/2022 Prepared by: Joellyn Rued  Exercises - Seated Hip Adduction  Isometrics with Ball  - 1 x daily - 7 x weekly - 2-3 sets - 10 reps - Standing Hip Extension with Resistance at Ankles and Counter Support  - 1 x daily - 7 x weekly - 2-3 sets - 10 reps - 3 hold - Standing Hip Abduction with Resistance at Ankles and Counter Support  - 1 x daily - 7 x weekly - 2-3 sets - 8 reps - Supine Bridge with Resistance Band  - 1 x daily - 7 x weekly - 2-3 sets - 10 reps - 3 hold - Hooklying Clamshell with Resistance  - 1 x daily - 7 x weekly - 2-3 sets - 10 reps - 3 hold - Standing Single Leg Stance with Counter Support  - 1 x daily - 7 x weekly - 1 sets - 2-3 reps - 20 hold - Hooklying Single Knee to Chest Stretch  - 1 x daily - 7 x weekly - 1 sets - 2-3 reps - 20 hold - Supine Piriformis Stretch with Foot on Ground  - 1 x daily - 7 x weekly - 1 sets - 2-3 reps - 20 hold - Supine Figure 4 Piriformis Stretch  - 1 x daily - 7 x weekly - 1 sets - 2-3 reps - 20 hold   ASSESSMENT:   CLINICAL IMPRESSION: Pt returns to PT reporting some improvement with her L hip pain. Pt reports consistancy with the completion of her HEP. PT was completed for hip ROM/stretching and strengthening. With the ROM/stretching therex, pt reports a stretching sensation vs pinching. Pt tolerated PT the prescribed therex without adverse effects. Pt's HEP was updated. From these therex, the pt was advised to completed 2-3 strengthening and 2-3 flexibility therex daily. Pt voiced understanding. Pt will continue to benefit from skilled PT to address impairments for improved function with less pain.   OBJECTIVE IMPAIRMENTS: Abnormal gait, decreased activity tolerance, decreased endurance, decreased mobility, difficulty walking, decreased ROM, decreased strength, improper body mechanics, postural dysfunction, and pain.    ACTIVITY LIMITATIONS: sitting, standing, squatting, transfers, and locomotion level   PARTICIPATION LIMITATIONS: occupation   PERSONAL FACTORS: Age, Time since onset of  injury/illness/exacerbation, and 1-2 comorbidities: HTN, anxiety  are also affecting patient's functional outcome.    REHAB POTENTIAL: Good   CLINICAL DECISION MAKING: Stable/uncomplicated   EVALUATION COMPLEXITY: Low     GOALS: Goals reviewed with patient? No   SHORT TERM GOALS: Target date: 09/02/2022 Pt will demonstrate appropriate understanding and performance of initially prescribed HEP in order to facilitate improved independence with management of symptoms.  Baseline: HEP provided on eval Goal status: INITIAL    2. Pt will score greater than or equal to 73 on FOTO in order to demonstrate improved perception of function due to symptoms.            Baseline: 69            Goal status: INITIAL     LONG TERM GOALS: Target date: 09/30/2022 Pt will score 76 or greater on FOTO in order to demonstrate improved perception of function due to symptoms.  Baseline: 69 Goal status: INITIAL  2.  Pt will demonstrate grossly symmetrical hip ER/IR passively without pain in order to improve functional ROM with transfers. Baseline: limited/painful IR/ER L hip compared to R Goal status: INITIAL   3.  Pt will report at least 50% decrease in overall pain levels in past week in order to facilitate improved tolerance to basic ADLs/mobility.             Baseline: 0-5/10            Goal status: INITIAL     4.  Pt will be able to perform 5xSTS in less than or equal to 10sec without UE support in order to demonstrate reduced fall risk and improved functional independence (MCID 5xSTS = 2.3 sec). Baseline: 12 sec min UE support Goal status: INITIAL    5. Pt will demonstrate at least 4+/5 hip ER/IR MMT on LLE in order to facilitate improved functional mechanics.             Baseline: see MMT chart above            Goal status: INITIAL        PLAN:   PT FREQUENCY: 1-2x/week   PT DURATION: 8 weeks   PLANNED INTERVENTIONS: Therapeutic exercises, Therapeutic activity, Neuromuscular re-education,  Balance training, Gait training, Patient/Family education, Self Care, Joint mobilization, Aquatic Therapy, Dry Needling, Spinal mobilization, Cryotherapy, Moist heat, Taping, Manual therapy, and Re-evaluation   PLAN FOR NEXT SESSION: review/update HEP PRN. Emphasis on rotational hip strength, closed chain stability, hip mobility. Manual PRN as indicated.    Cuinn Westerhold MS, PT 08/27/22 9:54 PM

## 2022-09-03 ENCOUNTER — Ambulatory Visit: Payer: Medicare HMO | Admitting: Physical Therapy

## 2022-09-08 NOTE — Therapy (Addendum)
OUTPATIENT PHYSICAL THERAPY TREATMENT NOTE + NO VISIT DISCHARGE SUMMARY (see below)    Patient Name: Elizabeth Roberts MRN: [id number] DOB:[birth year], 67 y.o., female Today's Date: 09/10/2022  PCP: Dois Davenport, MD   REFERRING PROVIDER: Delfin Gant, MD   END OF SESSION:   PT End of Session - 09/10/22 1335     Visit Number 3    Number of Visits 17    Date for PT Re-Evaluation 09/30/22    Authorization Type aetna medicare    Authorization Time Period no auth, kx mod visit 15    Progress Note Due on Visit 10    PT Start Time 1330    PT Stop Time 1415    PT Time Calculation (min) 45 min    Activity Tolerance Patient tolerated treatment well;No increased pain    Behavior During Therapy [facility] for tasks assessed/performed              Past Medical History:  Diagnosis Date   Abnormality of right breast on screening mammogram 02/08/2019   Anxiety    GERD (gastroesophageal reflux disease)    no meds   Hypertension    PONV (postoperative nausea and vomiting)    TMJ (dislocation of temporomandibular joint)    Past Surgical History:  Procedure Laterality Date   arm surgery     BREAST LUMPECTOMY WITH RADIOACTIVE SEED LOCALIZATION Right 02/08/2019   Procedure: Right breast seed localized lumpectomy (2 seeds);  Surgeon: Claud Kelp, MD;  Location: [facility];  Service: General;  Laterality: Right;   BREAST SURGERY Left    calcified tissue lumpectomy   MYOMECTOMY  1998   SHOULDER SURGERY     Patient Active Problem List   Diagnosis Date Noted   Abnormality of right breast on screening mammogram 02/08/2019    REFERRING DIAG: M25.552 (ICD-10-CM) - Pain in left hip   THERAPY DIAG:  Pain in left hip  Muscle weakness (generalized)  Stiffness of left hip, not elsewhere classified  Rationale for Evaluation and Treatment Rehabilitation  ONSET DATE: a couple months    SUBJECTIVE:    SUBJECTIVE STATEMENT: Pt reports her L hip is feeling a little  better.  Pt states pain in non existent today.  Pt notes she is completing her HEP consistently.   PERTINENT HISTORY: GERD, anxiety, HTN, hx of lumpectomies for mass removal (noncancerous per pt) PAIN:  Are you having pain: no Location/description: L hip, laterally  Best-worst over past week: 0-6/10  - aggravating factors: first waking (stiffness), fatigue after work, after standing/walking  - Easing factors: massage, heat, aleve, movement   PRECAUTIONS: None   WEIGHT BEARING RESTRICTIONS: No   FALLS:  Has patient fallen in last 6 months? No     LIVING ENVIRONMENT: 1 story house, 3STE vs 10STE w rail Lives w/ husband Household duties split between pt and spouse     OCCUPATION: nurse at [employer]    PLOF: Independent   PATIENT GOALS: build some strength   NEXT MD VISIT: TBD, follow up after PT    OBJECTIVE: (objective measures completed at initial evaluation unless otherwise dated)   DIAGNOSTIC FINDINGS:  No recent imaging in EPIC; Per pt, L hip XR ~4 weeks ago, states it was unremarkable    PATIENT SURVEYS:  FOTO 69 current, 76 predicted    COGNITION: Overall cognitive status: Within functional limits for tasks assessed  SENSATION: LT intact B LE    PALPATION: Deferred on eval   LOWER EXTREMITY ROM:      Active  Right eval Left eval  Hip flexion      Hip extension      Hip internal rotation      Hip external rotation      Knee extension      Knee flexion      (Blank rows = not tested) (Key: WFL = within functional limits not formally assessed, * = concordant pain, s = stiffness/stretching sensation, NT = not tested)  Comments:  Passively WNL RLE, pain with ~5 deg of ER/IR passively on LLE    LOWER EXTREMITY MMT:     MMT Right eval Left eval  Hip flexion 4+ 4  Hip abduction (modified sitting) 5 5  Hip internal rotation 5 4 *  Hip external rotation 5 4-  Knee flexion 5 5  Knee extension 5 5  Ankle dorsiflexion 5 5    (Blank  rows = not tested) (Key: WFL = within functional limits not formally assessed, * = concordant pain, s = stiffness/stretching sensation, NT = not tested)  Comments:     LOWER EXTREMITY SPECIAL TESTS:  FADDIR: + LLE ; negative RLE  FABER: unable to achieve testing position due to pain on LLE  Hip distraction: relief LLE AP mob: relief LLE    FUNCTIONAL TESTS:  5xSTS: 12.18sec minimal UE support at thighs, mild hip/knee discomfort  (noted increased L knee valgus and hip IR)   GAIT: Distance walked: within clinic Assistive device utilized: None Level of assistance: Complete Independence Comments: grossly WNL aside from mildly increased IR on LLE      TODAY'S TREATMENT:  [facility] Adult PT Treatment:                                                DATE: 09-10-22 1287.3 Norm 150 to 1905Ft Therapeutic Exercise: Recumbent bike 5 mins Sitting Figure 4 stretch 2x15" each Seated hip adduction iso x10 with ball between knees 10 sec hold Standing GTB hip 45 deg kickback x10 each x 3  Standing GTB hip abd x10 each x 3 Kickstand Deadlift with 15 # KB 1  2 x 10 Single leg stands  x2 20" each Step ups on 6 in step  2 x 10 each side Self Care: Walking program and education on KT tape benefits  [facility] Adult PT Treatment:                                                DATE: 08/27/22 Therapeutic Exercise: Nustep 5 mins SKTC 1x 15" each Pirifomis stretch 2x15" each Figure 4 stretch 2x15" each Seated hip adduction iso x10 Standing RTB hip 45 deg kickback x10 each Standing RTB hip abd x10 each Single leg stands  x2 20" each HEP update + education  [facility] Adult PT Treatment:                                                DATE: 08/05/22 Therapeutic Exercise: Seated hip adduction iso x10 Standing RTB hip 45 deg kickback x8 HEP handout + education     PATIENT EDUCATION:   Education details: Pt education on PT impairments, prognosis, and POC. Informed consent. Rationale for interventions, safe/appropriate HEP performance Person educated: Patient Education method: Explanation, Demonstration, Tactile cues, Verbal cues, and Handouts Education comprehension: verbalized understanding, returned demonstration, verbal cues required, tactile cues required, and needs further education     HOME EXERCISE PROGRAM: Access Code: [email or web address] URL: [email or web address] Date: 08/27/2022 Prepared by: Joellyn Rued  Exercises - Seated Hip Adduction Isometrics with Ball  - 1 x daily - 7 x weekly - 2-3 sets - 10 reps - Standing Hip Extension with Resistance at Ankles and Counter Support  - 1 x daily - 7 x weekly - 2-3 sets - 10 reps - 3 hold - Standing Hip Abduction with Resistance at Ankles and Counter Support  - 1 x daily - 7 x weekly - 2-3 sets - 8 reps - Supine Bridge with Resistance Band  - 1 x daily - 7 x weekly - 2-3 sets - 10 reps - 3 hold - Hooklying Clamshell with Resistance  - 1 x daily - 7 x weekly - 2-3 sets - 10 reps - 3 hold - Standing Single Leg Stance with Counter Support  - 1 x daily - 7 x weekly - 1 sets - 2-3 reps - 20 hold - Hooklying Single Knee to Chest Stretch  - 1 x daily - 7 x weekly - 1 sets - 2-3 reps - 20 hold - Supine Piriformis Stretch with Foot on Ground  - 1 x daily - 7 x weekly - 1 sets - 2-3 reps - 20 hold - Supine Figure 4 Piriformis Stretch  - 1 x daily - 7 x weekly - 1 sets - 2-3 reps - 20 hold   ASSESSMENT:   CLINICAL IMPRESSION: Ms Scire performs 1287.3 Norm 150 to 1905 Ft  Pt reports compliance with HEP. Pt does not do routine exercise but used to weight lift.  Pt works as a Engineer, civil (consulting). Pt session concentrated on increasing challenge with 15 # KB as well as encouraging walking and endurance training. Pt was educated on walking program and benefits of KT tape for hip and knee pain and when to use.   Pt tolerated PT the prescribed  therex without adverse effects.   OBJECTIVE IMPAIRMENTS: Abnormal gait, decreased activity tolerance, decreased endurance, decreased mobility, difficulty walking, decreased ROM, decreased strength, improper body mechanics, postural dysfunction, and pain.    ACTIVITY LIMITATIONS: sitting, standing, squatting, transfers, and locomotion level   PARTICIPATION LIMITATIONS: occupation   PERSONAL FACTORS: Age, Time since onset of injury/illness/exacerbation, and 1-2 comorbidities: HTN, anxiety  are also affecting patient's functional outcome.    REHAB POTENTIAL: Good   CLINICAL DECISION MAKING: Stable/uncomplicated   EVALUATION COMPLEXITY: Low     GOALS: Goals reviewed with patient? No   SHORT TERM GOALS: Target date: 09/02/2022 Pt will demonstrate appropriate understanding and performance of initially prescribed HEP in order to facilitate improved independence with management of symptoms.  Baseline: HEP provided on eval Goal status: INITIAL    2. Pt will score greater than  or equal to 73 on FOTO in order to demonstrate improved perception of function due to symptoms.            Baseline: 69            Goal status: INITIAL     LONG TERM GOALS: Target date: 09/30/2022 Pt will score 76 or greater on FOTO in order to demonstrate improved perception of function due to symptoms.  Baseline: 69 Goal status: INITIAL   2.  Pt will demonstrate grossly symmetrical hip ER/IR passively without pain in order to improve functional ROM with transfers. Baseline: limited/painful IR/ER L hip compared to R Goal status: INITIAL   3.  Pt will report at least 50% decrease in overall pain levels in past week in order to facilitate improved tolerance to basic ADLs/mobility.             Baseline: 0-5/10            Goal status: INITIAL     4.  Pt will be able to perform 5xSTS in less than or equal to 10sec without UE support in order to demonstrate reduced fall risk and improved functional independence (MCID  5xSTS = 2.3 sec). Baseline: 12 sec min UE support Goal status: INITIAL    5. Pt will demonstrate at least 4+/5 hip ER/IR MMT on LLE in order to facilitate improved functional mechanics.             Baseline: see MMT chart above            Goal status: INITIAL        PLAN:   PT FREQUENCY: 1-2x/week   PT DURATION: 8 weeks   PLANNED INTERVENTIONS: Therapeutic exercises, Therapeutic activity, Neuromuscular re-education, Balance training, Gait training, Patient/Family education, Self Care, Joint mobilization, Aquatic Therapy, Dry Needling, Spinal mobilization, Cryotherapy, Moist heat, Taping, Manual therapy, and Re-evaluation   PLAN FOR NEXT SESSION: review/update HEP PRN. Emphasis on rotational hip strength, closed chain stability, hip mobility. Manual PRN as indicated.    Garen Lah, PT, [facility] Certified Exercise Expert for the Aging Adult  09/10/22 3:14 PM Phone: [phone] Fax: [phone]    Discharge addendum:     PHYSICAL THERAPY DISCHARGE SUMMARY  Visits from Start of Care: 3  Current functional level related to goals / functional outcomes: Unable to assess   Remaining deficits: Unable to assess   Education / Equipment: Unable to assess  Patient goals were  unable to be assessed . Patient is being discharged due to not returning since the last visit.   Ashley Murrain PT, DPT 11/26/2022 2:48 PM

## 2022-09-10 ENCOUNTER — Ambulatory Visit: Payer: Medicare HMO | Admitting: Physical Therapy

## 2022-09-10 ENCOUNTER — Encounter: Payer: Self-pay | Admitting: Physical Therapy

## 2022-09-10 DIAGNOSIS — M6281 Muscle weakness (generalized): Secondary | ICD-10-CM

## 2022-09-10 DIAGNOSIS — M25552 Pain in left hip: Secondary | ICD-10-CM

## 2022-09-10 DIAGNOSIS — M25652 Stiffness of left hip, not elsewhere classified: Secondary | ICD-10-CM

## 2022-09-10 NOTE — Patient Instructions (Signed)
WALKING  Walking is a great form of exercise to increase your strength, endurance and overall fitness.  A walking program can help you start slowly and gradually build endurance as you go.  Everyone's ability is different, so each person's starting point will be different.  You do not have to follow them exactly.  The are just samples. You should simply find out what's right for you and stick to that program.   In the beginning, you'll start off walking 2-3 times a day for short distances.  As you get stronger, you'll be walking further at just 1-2 times per day. AMA recommends  150 to 300 minutes of moderate level exercise   walking 30 min to an hour 5 days a week   Minimum  A. You Can Walk For A Certain Length Of Time Each Day    Walk 5 minutes 3 times per day.  Increase 2 minutes every 2 days (3 times per day).  Work up to 25-30 minutes (1-2 times per day).   Example:   Day 1-2 5 minutes 3 times per day   Day 7-8 12 minutes 2-3 times per day   Day 13-14 25 minutes 1-2 times per day  B. You Can Walk For a Certain Distance Each Day     Distance can be substituted for time.    Example:   3 trips to mailbox (at road)   3 trips to corner of block   3 trips around the block  C. Go to local [employer] and use the track.    Walk for distance ____ around track  Or time ____ minutes  D. Walk ___x_ Jog ____ Run ___  Please only do the exercises that your therapist has initialed and dated   Garen Lah, PT, [facility] Certified Exercise Expert for the Aging Adult  09/10/22 1:55 PM Phone: [phone] Fax: [phone]
